# Patient Record
Sex: Male | Born: 1960 | Race: White | Hispanic: No | Marital: Single | Smoking: Former smoker
Health system: Southern US, Community
[De-identification: ages and names within clinical notes are randomized; demographics above are authoritative.]

## PROBLEM LIST (undated history)

## (undated) DIAGNOSIS — E785 Hyperlipidemia, unspecified: Secondary | ICD-10-CM

## (undated) DIAGNOSIS — M1990 Unspecified osteoarthritis, unspecified site: Secondary | ICD-10-CM

## (undated) HISTORY — PX: NO PAST SURGERIES: SHX2092

---

## 2000-12-25 ENCOUNTER — Inpatient Hospital Stay (HOSPITAL_COMMUNITY): Admission: AD | Admit: 2000-12-25 | Discharge: 2000-12-30 | Payer: Self-pay | Admitting: Specialist

## 2004-04-11 ENCOUNTER — Emergency Department (HOSPITAL_COMMUNITY): Admission: EM | Admit: 2004-04-11 | Discharge: 2004-04-11 | Payer: Self-pay | Admitting: Emergency Medicine

## 2004-10-28 ENCOUNTER — Ambulatory Visit: Payer: Self-pay | Admitting: Internal Medicine

## 2004-11-04 ENCOUNTER — Ambulatory Visit: Payer: Self-pay | Admitting: Internal Medicine

## 2010-02-25 ENCOUNTER — Emergency Department (HOSPITAL_COMMUNITY): Admission: EM | Admit: 2010-02-25 | Discharge: 2009-04-15 | Payer: Self-pay | Admitting: Emergency Medicine

## 2010-05-05 ENCOUNTER — Encounter (INDEPENDENT_AMBULATORY_CARE_PROVIDER_SITE_OTHER): Payer: Self-pay | Admitting: *Deleted

## 2010-05-12 NOTE — Letter (Signed)
Summary: Pre Visit Letter Revised  [facility]  [address] [place], [place] [address]   Phone: [phone]  Fax: [phone]        05/05/2010 MRN: [id number] [facility] [address] [place], [place]  [address]             Procedure Date:  06/16/2010 @ 10:00   Direct colon-Dr. Jarold Motto   Welcome to the Gastroenterology Division at [facility].    You are scheduled to see a nurse for your pre-procedure visit on 06/02/2010 at 10:30 on the 3rd floor at [facility], [address]  We ask that you try to arrive at our office 15 minutes prior to your appointment time to allow for check-in.  Please take a minute to review the attached form.  If you answer "Yes" to one or more of the questions on the first page, we ask that you call the person listed at your earliest opportunity.  If you answer "No" to all of the questions, please complete the rest of the form and bring it to your appointment.    Your nurse visit will consist of discussing your medical and surgical history, your immediate family medical history, and your medications.   If you are unable to list all of your medications on the form, please bring the medication bottles to your appointment and we will list them.  We will need to be aware of both prescribed and over the counter drugs.  We will need to know exact dosage information as well.    Please be prepared to read and sign documents such as consent forms, a financial agreement, and acknowledgement forms.  If necessary, and with your consent, a friend or relative is welcome to sit-in on the nurse visit with you.  Please bring your insurance card so that we may make a copy of it.  If your insurance requires a referral to see a specialist, please bring your referral form from your primary care physician.  No co-pay is required for this nurse visit.     If you cannot keep your appointment, please call [phone] to cancel or reschedule  prior to your appointment date.  This allows [place] the opportunity to schedule an appointment for another patient in need of care.    Thank you for choosing  Gastroenterology for your medical needs.  We appreciate the opportunity to care for you.  Please visit [place] at our website  to learn more about our practice.  Sincerely, The Gastroenterology Division

## 2010-06-06 LAB — POCT CARDIAC MARKERS
CKMB, poc: <1 ng/mL — ABNORMAL LOW (ref 1.0–8.0)
Troponin i, poc: <0.05 ng/mL (ref 0.00–0.09)

## 2010-06-06 LAB — BASIC METABOLIC PANEL
CO2: 27 mEq/L (ref 19–32)
GFR calc non Af Amer: >60 mL/min (ref >60)
Glucose, Bld: 113 mg/dL — ABNORMAL HIGH (ref 70–99)
Potassium: 5 mEq/L (ref 3.5–5.1)
Sodium: 134 mEq/L — ABNORMAL LOW (ref 135–145)

## 2010-06-06 LAB — DIFFERENTIAL
Basophils Absolute: 0 10*3/uL (ref 0.0–0.1)
Eosinophils Relative: 2 % (ref 0–5)
Lymphocytes Relative: 19 % (ref 12–46)
Monocytes Absolute: 1 10*3/uL (ref 0.1–1.0)
Monocytes Relative: 13 % — ABNORMAL HIGH (ref 3–12)

## 2010-06-06 LAB — CBC
HCT: 44.1 % (ref 39.0–52.0)
Hemoglobin: 14.2 g/dL (ref 13.0–17.0)
RBC: 5.9 MIL/uL — ABNORMAL HIGH (ref 4.22–5.81)
RDW: 15.9 % — ABNORMAL HIGH (ref 11.5–15.5)

## 2010-06-08 NOTE — Letter (Signed)
Summary: [facility] No Show Letter  [facility]  [address] [place], [place] [address]   Phone: [phone]  Fax: [phone]      June 02, 2010 MRN: [id number]   [facility] [address] [place], [place]  [address]      You were scheduled for a previsit for an endoscopic procedure with Dr. Jarold Motto on Wednesday 06/02/10 at the [facility] but you did not keep the appointment.    Your provider recommended this procedure for the benefit of your health.  It is very important that you reschedule it.  Failure to do so may be to the detriment of your health.  Please call us at [phone] and we will be happy to assist you with rescheduling.    If you were referred for this procedure by another physician/provider, we will notify him/her that you did not keep your appointment.   Sincerely, Doristine Church, RN  [facility]

## 2010-06-16 ENCOUNTER — Other Ambulatory Visit: Payer: Self-pay | Admitting: Gastroenterology

## 2010-08-06 NOTE — Discharge Summary (Signed)
[place]  Patient:    TALLIS, SOLEDAD Visit Number: [id number] MRN: [id number]          Service Type: MED Location: [id number] Attending Physician:  Erasmo Leventhal Dictated by:   Alexzandrew L. Perkins, P.A.-C. Admit Date:  12/25/2000 Discharge Date: 12/30/2000                             Discharge Summary  ADMISSION DIAGNOSES: 1. Cellulitis right lower extremity. 2. Recent contusion right leg.  DISCHARGE DIAGNOSES: 1. Cellulitis right lower extremity. 2. Recent contusion right leg. 3. Status post local incision and drainage in the office.  PROCEDURES:  There were no procedures in the hospital.  CONSULTS:  Infectious disease, Dewayne Shorter, M.D.  BRIEF HISTORY:  The patient is a 50 year old male who sustained a fall off a ladder and contused his right lower extremity approximately one and a half weeks prior to admission to the hospital.  He developed pain and swelling and therefore went to [facility].  He was seen initially and evaluated, started on anti-inflammatories and pain medications.  No fractures were noted. He did have some superficial abrasions to the shin.  He was started on antibiotics due to his swelling and erythema that occurred.  He was initially given a shot of Rocephin and placed on Keflex.  He was seen back in follow-up where a local I&D was performed on the right leg.  Due to the continued pain and swelling, he was referred over to [facility]. The patient was seen and evaluated by R. Valma Cava, M.D., and admitted for IV antibiotics.  LABORATORY DATA:  CBC on admission showed hemoglobin of 12.9, hematocrit 38.8, white cell count 9.3, red cell count 5.40; differential within normal limits. Chemistry panel on admission showed slightly elevated ALT of 59, remaining chemistry panel all within normal limits.  HOSPITAL COURSE:  The patient was admitted to [facility]. [facility], placed on bedrest, started on IV antibiotics.  He was placed on vancomycin. Infectious disease consult was called in. The patient was seen in consultation by Dr. Cliffton Asters of infectious disease.  Culture was taken in the office prior to admission. This was sent out to [facility].  The patient was placed on wound care and remained at bedrest with bathroom privileges.  X-rays of the leg were done prior to admission.  They did not reveal any foreign bodies in the area.  The leg was followed very closely from day to day. The swelling and erythema did improve and respond to the IV antibiotics.  It was felt as long as the patient continued to improve, that he may be able to be switched over to p.o. antibiotics.  He continued to receive IV vancomycin for several days and wet-to-dry dressing changes to the open wound.  It was felt after several days of IV antibiotics that the patient could be safely converted over to Levaquin 500 q.d. for approximately 10 to 14 more days.  He continued to improve and by December 30, 2000, the patient was doing quite well, much less induration and erythema.  It was decided the patient would be switched over to Levaquin and was discharged home on December 30, 2000.  DISCHARGE PLAN: 1. The patient discharged to home on December 30, 2000. 2. Discharge diagnoses:  Please see above. 3. Discharge medications:    a. Levaquin 500 mg one p.o. q.d. for two  weeks.    b. Enteric coated aspirin by mouth daily.    c. Senokot 30 b.i.d.    d. Percocet 40 p.r.n. pain. 4. Diet as tolerated. 5. Activity:  Continue with wound care.  Elevation for edema. 6. Follow-up:  He will follow up in the office on Monday following discharge.  DISPOSITION:  Home.  CONDITION ON DISCHARGE:  Improving.  Dictated by:   Alexzandrew L. Perkins, P.A.-C. Attending Physician:  Erasmo Leventhal DD:  01/25/01 TD:  01/26/01 Job: [id number]

## 2010-08-06 NOTE — H&P (Signed)
[place]  Patient:    Russell Brennan, Russell Brennan Visit Number: [id number] MRN: [id number]          Service Type: Attending:  R. Valma Cava, M.D. Dictated by:   Ottie Glazier. Wynona Neat, P.A.-C. Adm. Date:  12/25/00                           History and Physical  DATE OF BIRTH: 1961-02-11  CHIEF COMPLAINT: Right leg pain.  HISTORY OF PRESENT ILLNESS: This is a 50 year old white male, who approximately 1-1/2 weeks ago fell off a ladder and landed on his shins.  The patient developed pain and swelling and therefore he went to [facility].  The patient was initially seen and evaluated and started on anti-inflammatories and pain medicines.  There was no fracture noted.  He did have some abrasions to the bilateral shins, for which he was subsequently started on antibiotics at [facility] as well.  Swelling and erythema continued over his bilateral lower extremities.  Therefore, he was given a shot of Rocephin and continued on Keflex, and subsequently he was seen and evaluated by Dr. Andi Devon at [facility], where an I&D was performed, as well as he was given a shot of Rocephin.  However, due to these treatments the patient states that the leg has persistently continued to give him problems; i.e., pain, swelling, and erythema.  Therefore, he was referred to be evaluated at the acute care clinic here at [facility].  He is currently taking Percocet 7.5 mg one to two p.o. q.4h to q.6h p.r.n. pain as well as Levaquin one q.d. starting this Friday.  ALLERGIES: No known drug allergies.  MEDICATIONS: See HPI.  PAST MEDICAL HISTORY: Negative.  PAST SURGICAL HISTORY: I&D of right leg, as mentioned in HPI.  SOCIAL HISTORY: He is single.  He does not smoke.  Denies any alcohol use.  FAMILY HISTORY: States his father has a "heart condition."  REVIEW OF SYSTEMS: GENERAL: The patient states he has occasional night sweats but this is secondary to his  not having an air conditioner.  No loss of appetite, loss in weight, fever or chills.  HEENT: No history of chronic headache, sore throat, runny nose.  CHEST: No history of chronic cough, persistent cough, hemoptysis, or productive cough.  CARDIOVASCULAR: He denies any shortness of breath or syncopal episodes, chest pain, or irregular heart beat.  GI/GU: Denies any history of chronic diarrhea, constipation, melena, bright red stools per rectum, or dysuria.  EXTREMITIES: Please see HPI. NEUROLOGIC: Denies any history of seizure or strokes, numbness, tingling, paresthesias, or paralysis.  PHYSICAL EXAMINATION:  VITAL SIGNS: Blood pressure 128/80, pulse 70, respirations 16.  GENERAL: This is a pleasant 50 year old white male, in no acute distress.  HEENT: Head normocephalic, atraumatic.  NECK: Supple without masses.  CHEST: Clear to auscultation bilaterally.  HEART: S1 and S2, regular rate and rhythm.  ABDOMEN: Soft, flat, nontender.  Bowel sounds positive.  EXTREMITIES:  The right lower extremity is erythematous, hot, and swollen. There is incision from previous I&D.  There is evidence of old blistering noted.  He has some abrasions about the left lower extremity as well.  His right ankle is swollen with some ecchymosis located posteriorly to the medial malleolus.  LABORATORY DATA/X-RAYS: Laboratories of the right lower extremity are unremarkable.  IMPRESSION: Cellulitis, right lower extremity.  PLAN: The patient will be admitted to [facility]. [facility] per Dr. Thomasena Edis, where he  will undergo IV antibiotics and will continue to monitor. Prior to admission he underwent I&D as well as insertion of a Penrose drain in the office. Dictated by:   Ottie Glazier. Wynona Neat, P.A.-C. Attending:  R. Valma Cava, M.D. DD:  12/25/00 TD:  12/26/00 Job: [id number]

## 2012-05-06 ENCOUNTER — Emergency Department (HOSPITAL_COMMUNITY)
Admission: EM | Admit: 2012-05-06 | Discharge: 2012-05-06 | Disposition: A | Discharge status: Home / Self Care | Payer: Self-pay | Attending: Emergency Medicine | Admitting: Emergency Medicine

## 2012-05-06 ENCOUNTER — Encounter (HOSPITAL_COMMUNITY): Payer: Self-pay | Admitting: Emergency Medicine

## 2012-05-06 DIAGNOSIS — M7989 Other specified soft tissue disorders: Secondary | ICD-10-CM | POA: Insufficient documentation

## 2012-05-06 DIAGNOSIS — Z8639 Personal history of other endocrine, nutritional and metabolic disease: Secondary | ICD-10-CM | POA: Insufficient documentation

## 2012-05-06 DIAGNOSIS — J029 Acute pharyngitis, unspecified: Secondary | ICD-10-CM | POA: Insufficient documentation

## 2012-05-06 DIAGNOSIS — Z862 Personal history of diseases of the blood and blood-forming organs and certain disorders involving the immune mechanism: Secondary | ICD-10-CM | POA: Insufficient documentation

## 2012-05-06 DIAGNOSIS — K122 Cellulitis and abscess of mouth: Secondary | ICD-10-CM

## 2012-05-06 DIAGNOSIS — L409 Psoriasis, unspecified: Secondary | ICD-10-CM

## 2012-05-06 DIAGNOSIS — IMO0002 Reserved for concepts with insufficient information to code with codable children: Secondary | ICD-10-CM | POA: Insufficient documentation

## 2012-05-06 DIAGNOSIS — R197 Diarrhea, unspecified: Secondary | ICD-10-CM | POA: Insufficient documentation

## 2012-05-06 DIAGNOSIS — R110 Nausea: Secondary | ICD-10-CM | POA: Insufficient documentation

## 2012-05-06 DIAGNOSIS — Z87828 Personal history of other (healed) physical injury and trauma: Secondary | ICD-10-CM | POA: Insufficient documentation

## 2012-05-06 DIAGNOSIS — Z87891 Personal history of nicotine dependence: Secondary | ICD-10-CM | POA: Insufficient documentation

## 2012-05-06 DIAGNOSIS — R350 Frequency of micturition: Secondary | ICD-10-CM | POA: Insufficient documentation

## 2012-05-06 DIAGNOSIS — B349 Viral infection, unspecified: Secondary | ICD-10-CM

## 2012-05-06 DIAGNOSIS — B9789 Other viral agents as the cause of diseases classified elsewhere: Secondary | ICD-10-CM | POA: Insufficient documentation

## 2012-05-06 DIAGNOSIS — L299 Pruritus, unspecified: Secondary | ICD-10-CM | POA: Insufficient documentation

## 2012-05-06 DIAGNOSIS — R591 Generalized enlarged lymph nodes: Secondary | ICD-10-CM

## 2012-05-06 DIAGNOSIS — R599 Enlarged lymph nodes, unspecified: Secondary | ICD-10-CM | POA: Insufficient documentation

## 2012-05-06 DIAGNOSIS — L408 Other psoriasis: Secondary | ICD-10-CM | POA: Insufficient documentation

## 2012-05-06 HISTORY — DX: Hyperlipidemia, unspecified: E78.5

## 2012-05-06 LAB — GLUCOSE, CAPILLARY: Glucose-Capillary: 103 mg/dL — ABNORMAL HIGH (ref 70–99)

## 2012-05-06 MED ORDER — TRIAMCINOLONE ACETONIDE 0.1 % EX CREA: TOPICAL_CREAM | Freq: Three times a day (TID) | CUTANEOUS | Status: DC

## 2012-05-06 MED ORDER — AZITHROMYCIN 250 MG PO TABS
500.0000 mg | ORAL_TABLET | Freq: Once | ORAL | Status: AC
  Administered 2012-05-06: 500 mg via ORAL
  Filled 2012-05-06: qty 2

## 2012-05-06 MED ORDER — DEXAMETHASONE 10 MG/ML FOR PEDIATRIC ORAL USE
10.0000 mg | Freq: Once | INTRAMUSCULAR | Status: AC
  Administered 2012-05-06: 10 mg via ORAL
  Filled 2012-05-06: qty 1

## 2012-05-06 MED ORDER — PREDNISONE 20 MG PO TABS: 40.0000 mg | ORAL_TABLET | Freq: Every day | ORAL | Status: DC

## 2012-05-06 MED ORDER — AZITHROMYCIN 250 MG PO TABS: 250.0000 mg | ORAL_TABLET | Freq: Every day | ORAL | Status: DC

## 2012-05-06 MED ORDER — AMOXICILLIN 500 MG PO CAPS
500.0000 mg | ORAL_CAPSULE | Freq: Once | ORAL | Status: DC
  Filled 2012-05-06: qty 1

## 2012-05-06 NOTE — ED Notes (Addendum)
Pt states that he began having bumps/on his left arm 3 weeks ago. More bumps have come and gone since then. Pt states he was bit by a small black round spider a month ago. Pt believes it could also possibly be scabies but he has no knowledge of any exposure. Pt has a newer bump on his left neck along with some minor swelling. Pt also stating that his head is feeling "funny."

## 2012-05-06 NOTE — ED Provider Notes (Signed)
History    This chart was scribed for non-physician practitioner working with Russell Russell B. Bernette Mayers, MD by Donne Anon, ED Scribe. This patient was seen in room WTR5/WTR5 and the patient's care was started at 2227.   CSN: [id number]  Arrival date & time 05/06/12  1851   First MD Initiated Contact with Patient 05/06/12 2227      Chief Complaint  Patient presents with  . Rash    The history is provided by the patient and medical records. No language interpreter was used.   LATRAVIOUS Brennan is a 52 y.o. male who presents to the Emergency Department complaining of gradual onset, constant, non changing red bumps on his left arm and both ankles described as itching and burning which began 3 weeks ago. He reports swollen lymph nodes on his neck that began today associated with sore throat which also began today. He reports increased uirinary frequency, diarrhea, and nausea. He denies any other pain. He has tried clove oil on the rash with moderate relief. He has a Hx of hyperlipidemia.  Past Medical History  Diagnosis Date  . Hyperlipemia     History reviewed. No pertinent past surgical history.  Family History  Problem Relation Age of Onset  . Cancer Mother   . Heart failure Father     History  Substance Use Topics  . Smoking status: Former Smoker    Quit date: 04/06/2007  . Smokeless tobacco: Never Used  . Alcohol Use: Yes     Comment: occasionally      Review of Systems  Constitutional: Negative for fever, diaphoresis, appetite change, fatigue and unexpected weight change.  HENT: Positive for sore throat. Negative for mouth sores and neck stiffness.   Eyes: Negative for visual disturbance.  Respiratory: Negative for cough, chest tightness, shortness of breath, wheezing and stridor.   Cardiovascular: Negative for chest pain.  Gastrointestinal: Positive for nausea and diarrhea. Negative for vomiting, abdominal pain and constipation.  Endocrine: Negative for polydipsia,  polyphagia and polyuria.  Genitourinary: Positive for frequency. Negative for dysuria, urgency and hematuria.  Musculoskeletal: Negative for back pain.  Skin: Positive for rash.  Allergic/Immunologic: Negative for immunocompromised state.  Neurological: Negative for syncope, light-headedness and headaches.  Hematological: Positive for adenopathy. Does not bruise/bleed easily.  Psychiatric/Behavioral: Negative for sleep disturbance. The patient is not nervous/anxious.   All other systems reviewed and are negative.    Allergies  Amoxicillin  Home Medications   Current Outpatient Rx  Name  Route  Sig  Dispense  Refill  . omega-3 acid ethyl esters (LOVAZA) 1 G capsule   Oral   Take 1 g by mouth 2 (two) times daily.         Marland Kitchen azithromycin (ZITHROMAX) 250 MG tablet   Oral   Take 1 tablet (250 mg total) by mouth daily. Take first 2 tablets together, then 1 every day until finished.   6 tablet   0   . predniSONE (DELTASONE) 20 MG tablet   Oral   Take 2 tablets (40 mg total) by mouth daily.   10 tablet   0   . triamcinolone cream (KENALOG) 0.1 %   Topical   Apply topically 3 (three) times daily.   30 g   0     BP 160/106  Pulse 87  Temp(Src) 98.5 F (36.9 C) (Oral)  Resp 18  Ht 6\' 1"  (1.854 m)  Wt 260 lb (117.935 kg)  BMI 34.31 kg/m2  SpO2 95%  Physical Exam  Nursing note and vitals reviewed. Constitutional: He is oriented to person, place, and time. He appears well-developed and well-nourished. No distress.  HENT:  Head: Normocephalic and atraumatic.  Right Ear: Tympanic membrane, external ear and ear canal normal. Tympanic membrane is not erythematous and not bulging.  Left Ear: Tympanic membrane, external ear and ear canal normal. Tympanic membrane is not erythematous and not bulging.  Nose: No mucosal edema or rhinorrhea. No epistaxis. Right sinus exhibits no maxillary sinus tenderness and no frontal sinus tenderness. Left sinus exhibits no maxillary sinus  tenderness and no frontal sinus tenderness.  Mouth/Throat: Uvula is midline and mucous membranes are normal. Mucous membranes are not pale and not cyanotic. Edematous present. Posterior oropharyngeal edema (mild) and posterior oropharyngeal erythema (mild) present. No oropharyngeal exudate or tonsillar abscesses.  Uvula swelling and mild erythema.  Eyes: Conjunctivae and EOM are normal. Pupils are equal, round, and reactive to light. No scleral icterus.  Neck: Normal range of motion and full passive range of motion without pain. Neck supple. No tracheal tenderness, no spinous process tenderness and no muscular tenderness present. No rigidity. No tracheal deviation and normal range of motion present. No Brudzinski's sign and no Kernig's sign noted.  Cardiovascular: Normal rate, regular rhythm, S1 normal, S2 normal, normal heart sounds and intact distal pulses.  Exam reveals no gallop and no friction rub.   No murmur heard. Pulses:      Radial pulses are 2+ on the right side, and 2+ on the left side.       Dorsalis pedis pulses are 2+ on the right side, and 2+ on the left side.       Posterior tibial pulses are 2+ on the right side, and 2+ on the left side.  Pulmonary/Chest: Effort normal and breath sounds normal. No stridor. No respiratory distress. He has no decreased breath sounds. He has no wheezes. He has no rhonchi. He has no rales. He exhibits no tenderness.  Abdominal: Soft. Normal appearance and bowel sounds are normal. He exhibits no mass. There is no tenderness. There is no rigidity, no rebound, no guarding, no CVA tenderness, no tenderness at McBurney's point and negative Murphy's sign.  Musculoskeletal: Normal range of motion. He exhibits no edema.  Lymphadenopathy:       Head (right side): Tonsillar adenopathy present. No submental, no submandibular, no preauricular, no posterior auricular and no occipital adenopathy present.       Head (left side): Tonsillar adenopathy present. No  submental, no submandibular, no preauricular, no posterior auricular and no occipital adenopathy present.    He has cervical adenopathy.       Right cervical: Superficial cervical adenopathy present. No deep cervical and no posterior cervical adenopathy present.      Left cervical: Superficial cervical adenopathy present. No deep cervical and no posterior cervical adenopathy present.    He has no axillary adenopathy.  Neurological: He is alert and oriented to person, place, and time.  Speech is clear and goal oriented Moves extremities without ataxia  Skin: Skin is warm and dry. Rash noted. He is not diaphoretic. There is erythema.  Left elbow extensor surface has 4 small, erythematous, plaque like silver scaling lesions with well defined margins without evidence of cellulitis. The area is excoriated. Bilateral lower leg has venous statis changes, as well  As erythematous, plaque like silver scaling lesions with well defined margins without evidence of cellulitis and significant excoriations.  Psychiatric: He has a normal mood and affect.    ED  Course  Procedures (including critical care time) DIAGNOSTIC STUDIES: Oxygen Saturation is 95% on room air, adequate by my interpretation.    COORDINATION OF CARE: 10:42 PM Discussed treatment plan which includes a blood sugar check, steroid, antibiotic and medication for cyrtosis with pt at bedside and pt agreed to plan.     Labs Reviewed  GLUCOSE, CAPILLARY - Abnormal; Notable for the following:    Glucose-Capillary 103 (*)    All other components within normal limits   No results found.   1. Viral syndrome   2. Lymphadenopathy   3. Uvulitis   4. Psoriasis       MDM  Elita Boone with symptoms consistent with a viral syndrome symptoms. Patient with mild tubulitis. He is afebrile, non-tachycardic, NAD, nontoxic appearing. He is handling his secretions without difficulty, he has no stridor. He has mild lymphadenopathy.  We'll give  antibiotics and prednisone for uvulitis.  Patient also with rash rarely the lower legs including the extensor surfaces consistent with psoriasis.  History of minor skin trauma to this area.  Will treat with triamcinolone and I recommended followup with dermatology.  I have also discussed reasons to return immediately to the ER.  Patient expresses understanding and agrees with plan.  Patient blood sugar 103 and no concern for diabetes.   1. Medications: Azithromycin, prednisone, triamcinolone, usual home medications 2. Treatment: rest, drink plenty of fluids, take medications as prescribed, do not scratch 3. Follow Up: Please followup with your primary doctor for discussion of your diagnoses and further evaluation after today's visit; if you do not have a primary care doctor use the resource guide provided to find one; followup with dermatology as needed   I personally performed the services described in this documentation, which was scribed in my presence. The recorded information has been reviewed and is accurate.        Dahlia Client Celisse Ciulla, PA-C 05/06/12 2342  Dahlia Client Aundreya Souffrant, PA-C 05/06/12 1610

## 2012-05-06 NOTE — ED Provider Notes (Signed)
Medical screening examination/treatment/procedure(s) were performed by non-physician practitioner and as supervising physician I was immediately available for consultation/collaboration.   Charles B. Bernette Mayers, MD 05/06/12 8469

## 2013-09-20 ENCOUNTER — Observation Stay (HOSPITAL_COMMUNITY)
Admission: EM | Admit: 2013-09-20 | Discharge: 2013-09-21 | Disposition: A | Discharge status: Home / Self Care | Payer: Self-pay | Attending: Internal Medicine | Admitting: Internal Medicine

## 2013-09-20 ENCOUNTER — Encounter (HOSPITAL_COMMUNITY): Payer: Self-pay | Admitting: Emergency Medicine

## 2013-09-20 ENCOUNTER — Observation Stay (HOSPITAL_COMMUNITY): Payer: Self-pay

## 2013-09-20 ENCOUNTER — Emergency Department (HOSPITAL_COMMUNITY): Payer: Self-pay

## 2013-09-20 DIAGNOSIS — R718 Other abnormality of red blood cells: Secondary | ICD-10-CM

## 2013-09-20 DIAGNOSIS — Z87891 Personal history of nicotine dependence: Secondary | ICD-10-CM | POA: Insufficient documentation

## 2013-09-20 DIAGNOSIS — Z880 Allergy status to penicillin: Secondary | ICD-10-CM | POA: Insufficient documentation

## 2013-09-20 DIAGNOSIS — M79609 Pain in unspecified limb: Secondary | ICD-10-CM | POA: Insufficient documentation

## 2013-09-20 DIAGNOSIS — Z862 Personal history of diseases of the blood and blood-forming organs and certain disorders involving the immune mechanism: Secondary | ICD-10-CM | POA: Insufficient documentation

## 2013-09-20 DIAGNOSIS — Z8639 Personal history of other endocrine, nutritional and metabolic disease: Secondary | ICD-10-CM | POA: Insufficient documentation

## 2013-09-20 DIAGNOSIS — R079 Chest pain, unspecified: Principal | ICD-10-CM | POA: Insufficient documentation

## 2013-09-20 DIAGNOSIS — L409 Psoriasis, unspecified: Secondary | ICD-10-CM | POA: Diagnosis present

## 2013-09-20 DIAGNOSIS — R0602 Shortness of breath: Secondary | ICD-10-CM | POA: Insufficient documentation

## 2013-09-20 DIAGNOSIS — L408 Other psoriasis: Secondary | ICD-10-CM

## 2013-09-20 DIAGNOSIS — E785 Hyperlipidemia, unspecified: Secondary | ICD-10-CM | POA: Diagnosis present

## 2013-09-20 HISTORY — DX: Unspecified osteoarthritis, unspecified site: M19.90

## 2013-09-20 LAB — BASIC METABOLIC PANEL
Anion gap: 14 (ref 5–15)
BUN: 15 mg/dL (ref 6–23)
CHLORIDE: 98 meq/L (ref 96–112)
CO2: 25 meq/L (ref 19–32)
CREATININE: 0.98 mg/dL (ref 0.50–1.35)
Calcium: 9.4 mg/dL (ref 8.4–10.5)
GFR calc Af Amer: >90 mL/min (ref >90)
GFR calc non Af Amer: >90 mL/min (ref >90)
GLUCOSE: 97 mg/dL (ref 70–99)
Potassium: 4.1 mEq/L (ref 3.7–5.3)
Sodium: 137 mEq/L (ref 137–147)

## 2013-09-20 LAB — CBC
HEMATOCRIT: 41.8 % (ref 39.0–52.0)
Hemoglobin: 14 g/dL (ref 13.0–17.0)
MCH: 24.3 pg — AB (ref 26.0–34.0)
MCHC: 33.5 g/dL (ref 30.0–36.0)
MCV: 72.7 fL — AB (ref 78.0–100.0)
Platelets: 258 10*3/uL (ref 150–400)
RBC: 5.75 MIL/uL (ref 4.22–5.81)
RDW: 14.5 % (ref 11.5–15.5)
WBC: 7.3 10*3/uL (ref 4.0–10.5)

## 2013-09-20 LAB — I-STAT TROPONIN, ED: Troponin i, poc: 0 ng/mL (ref 0.00–0.08)

## 2013-09-20 LAB — D-DIMER, QUANTITATIVE (NOT AT ARMC): D DIMER QUANT: 0.32 ug{FEU}/mL (ref 0.00–0.48)

## 2013-09-20 LAB — TROPONIN I

## 2013-09-20 MED ORDER — NITROGLYCERIN 0.4 MG SL SUBL
0.4000 mg | SUBLINGUAL_TABLET | SUBLINGUAL | Status: DC | PRN
  Administered 2013-09-20: 0.4 mg via SUBLINGUAL
  Filled 2013-09-20: qty 1

## 2013-09-20 MED ORDER — MORPHINE SULFATE 2 MG/ML IJ SOLN: 2.0000 mg | INTRAMUSCULAR | Status: DC | PRN

## 2013-09-20 MED ORDER — ASPIRIN 81 MG PO CHEW
324.0000 mg | CHEWABLE_TABLET | Freq: Once | ORAL | Status: AC
  Administered 2013-09-20: 324 mg via ORAL
  Filled 2013-09-20: qty 4

## 2013-09-20 MED ORDER — ENOXAPARIN SODIUM 40 MG/0.4ML ~~LOC~~ SOLN
40.0000 mg | SUBCUTANEOUS | Status: DC
  Administered 2013-09-20: 40 mg via SUBCUTANEOUS
  Filled 2013-09-20 (×2): qty 0.4

## 2013-09-20 MED ORDER — ONDANSETRON HCL 4 MG/2ML IJ SOLN: 4.0000 mg | Freq: Four times a day (QID) | INTRAMUSCULAR | Status: DC | PRN

## 2013-09-20 MED ORDER — ACETAMINOPHEN 325 MG PO TABS: 650.0000 mg | ORAL_TABLET | ORAL | Status: DC | PRN

## 2013-09-20 MED ORDER — GI COCKTAIL ~~LOC~~: 30.0000 mL | Freq: Four times a day (QID) | ORAL | Status: DC | PRN

## 2013-09-20 NOTE — ED Notes (Signed)
Attempted report 

## 2013-09-20 NOTE — ED Notes (Signed)
EKG staff notified EKG not crossing over. sts unable to find EKG in system. Will re-capture.

## 2013-09-20 NOTE — ED Notes (Signed)
Patient transported to X-ray 

## 2013-09-20 NOTE — ED Notes (Signed)
Per pt sts chest discmfort longer than a month and woke up this am and worse. sts he took cayenne pepper this am . sts left arm numbness and lightheaded.

## 2013-09-20 NOTE — H&P (Signed)
[facility] History and Physical  Russell BooneDavid C Brennan MWN:[id number] DOB: 1961-02-10 DOA: 09/20/2013  Referring physician: Tiffany KocherScott Brennan, ER physician PCP: No PCP Per Patient   Chief Complaint: Chest pain  HPI: Russell Brennan is a 53 y.o. male  With past oral history of hyperlipidemia who presented to the emergency room with complaints of chest pain today, 09/20/13. In the per history, patient actually admits that he's had this pain going on for quite some time he is possible is longer than one year. Has gotten worse over the past month and today felt it was pretty bad. Chest pain is described as sort of an aching nagging catching-like feeling over the left side of his chest with radiation to his left axilla and down his left arm to about the elbow. He states it is worse when he tries to sit up rolling forward or turning certain direction. When asked him if it made him short of breath he said yes at times. He's had no dizziness from this. The chest pain does not radiate elsewhere. Today it was bad as an 8 or 9/10 she came into the emergency room. In the emergency room, EKG, lab work and chest x-ray were unremarkable. Troponins were normal. Patient was given nitroglycerin which seemed to greatly bring down his pain. Hospitals were called for further evaluation and admission.   Review of Systems:  Seen after arriving to floor. Doing okay. Denies any headaches, vision changes, dysphagia, palpitations, shortness of breath, wheeze, cough, abdominal pain, hematuria, dysuria, constipation, diarrhea, focal extremity numbness or weakness or pain other than described above. No nausea vomiting. His only positive review of systems as a mild nagging catching feeling in the left side of his chest radiating to his left axilla and down part of his left arm. His review systems otherwise negative.   Past Medical History  Diagnosis Date  . Hyperlipemia    History reviewed. No pertinent past surgical  history. Social History:  reports that he quit smoking about 6 years ago. He has never used smokeless tobacco. He reports that he drinks alcohol. He reports that he uses illicit drugs. patient lives at home alone. He is quite active requiring no assistance  Allergies  Allergen Reactions  . Amoxicillin Other (See Comments)    Unknown     Family History  Problem Relation Age of Onset  . Cancer Mother   . Heart failure Father    hyperlipidemia on both sides of the family  Prior to Admission medications   Not on File   Physical Exam: Filed Vitals:   09/20/13 1802  BP: 129/75  Pulse: 62  Temp: 98.1 F (36.7 C)  Resp: 18    BP 129/75  Pulse 62  Temp(Src) 98.1 F (36.7 C) (Oral)  Resp 18  SpO2 100%  General:  Appears calm and comfortable Eyes: Sclera nonicteric, extraocular movements are intact ENT: Normocephalic, atraumatic, mucous membranes are moist Neck: No JVD Cardiovascular: Regular rate and rhythm, S1-S2, soft 2/6 benign murmur Chest wall: Left side is mildly tender with palpation Respiratory: Clear to auscultation bilaterally Abdomen: Soft, nontender, nondistended, positive bowel sounds Skin: Mild dark plaques on bilateral anterior legs below the knees. Musculoskeletal: No clubbing or cyanosis or edema. Patient does have some pain in the left axilla left chest region with left shoulder extension and rotation Psychiatric: Patient is appropriate, no evidence of psychosis he Neurologic: No focal deficits           Labs on Admission:  Basic Metabolic  Panel:  Recent Labs Lab 09/20/13 1433  NA 137  K 4.1  CL 98  CO2 25  GLUCOSE 97  BUN 15  CREATININE 0.98  CALCIUM 9.4   Liver Function Tests: No results found for this basename: AST, ALT, ALKPHOS, BILITOT, PROT, ALBUMIN,  in the last 168 hours No results found for this basename: LIPASE, AMYLASE,  in the last 168 hours No results found for this basename: AMMONIA,  in the last 168 hours CBC:  Recent  Labs Lab 09/20/13 1433  WBC 7.3  HGB 14.0  HCT 41.8  MCV 72.7*  PLT 258   Cardiac Enzymes: No results found for this basename: CKTOTAL, CKMB, CKMBINDEX, TROPONINI,  in the last 168 hours  BNP (last 3 results) No results found for this basename: PROBNP,  in the last 8760 hours CBG: No results found for this basename: GLUCAP,  in the last 168 hours  Radiological Exams on Admission: Dg Chest 2 View  09/20/2013   CLINICAL DATA:  Left-sided chest for 2 weeks ; previous history of tobacco use  EXAM: CHEST  2 VIEW  COMPARISON:  PA and lateral chest of April 15, 2009  FINDINGS: The lungs are well-expanded and clear. The heart and mediastinal structures are normal. There is no pleural effusion. The bony thorax is unremarkable.  IMPRESSION: There is no acute cardiopulmonary abnormality.   Electronically Signed   By: Akiva  [place]   On: 09/20/2013 15:24    EKG: Independently reviewed. Normal sinus rhythm  Assessment/Plan Principal Problem:   Chest pain: Suspect this may be more musculoskeletal given the atypical nature, tenderness to chest wall and especially when moving the arm and shoulder. We'll check left shoulder x-ray. Could consider shoulder MRI Active Problems:   Microcytosis: Incidentally noted. MCV at 72, but with normal hemoglobin.   Psoriasis: Appears to be relatively quiet. Patient first plaques a year or 2 ago.    Hyperlipidemia: Patient states that he was previously diagnosed with this. Advised to go on medication, but he wanted to try diet only. Recheck labs in the morning. May need statin.   Code Status: Full code  Family Communication: No family  Disposition Plan: Possibly home tomorrow  Time spent: 25 minutes  Hollice EspyKRISHNAN,Shaunn Tackitt K [facility] Pager [phone]  **Disclaimer: This note may have been dictated with voice recognition software. Similar sounding words can inadvertently be transcribed and this note may contain transcription errors which may not have  been corrected upon publication of note.**

## 2013-09-20 NOTE — ED Provider Notes (Signed)
CSN: [id number]     Arrival date & time 09/20/13  1415 History   First MD Initiated Contact with Patient 09/20/13 1501     Chief Complaint  Patient presents with  . Chest Pain     (Consider location/radiation/quality/duration/timing/severity/associated sxs/prior Treatment) HPI 53 year old male presents with chest pain since last night. He states for the past month he's been having intermittent chest pain that has been much milder than this. He states last night started having more severe pain. Currently rates the pain as a 7/10. Describes as a dull aching pain on the left side of his chest and left arm. Feels somewhat short of breath. It is worse with exertion and movement. States it is somewhat worse with inspiration. Denies any cough or fever. Is unable to clarify how long it was typically lasting over the past month or how often he came on. Does not know what had previously brought it on. We went to work today he states he had to rest more often than normal because of the pain. Took some cayenne pepper today and states the pain did not worsen since then. He had pain when he went to bed last night and since waking up has had continuous pain as well. He has a history of hyperlipidemia, is not on any meds. Denies HTN or DM. Father died of heart attack at age 55, unsure of when his first onset of cardiac disease was.  Past Medical History  Diagnosis Date  . Hyperlipemia    History reviewed. No pertinent past surgical history. Family History  Problem Relation Age of Onset  . Cancer Mother   . Heart failure Father    History  Substance Use Topics  . Smoking status: Former Smoker    Quit date: 04/06/2007  . Smokeless tobacco: Never Used  . Alcohol Use: Yes     Comment: occasionally    Review of Systems  Constitutional: Negative for fever and diaphoresis.  Respiratory: Positive for shortness of breath.   Cardiovascular: Positive for chest pain. Negative for leg swelling.   Gastrointestinal: Negative for nausea, vomiting and abdominal pain.  Musculoskeletal: Negative for back pain.  All other systems reviewed and are negative.     Allergies  Amoxicillin  Home Medications   Prior to Admission medications   Not on File   BP 134/77  Pulse 74  Temp(Src) 97.8 F (36.6 C) (Oral)  Resp 15  SpO2 97% Physical Exam  Nursing note and vitals reviewed. Constitutional: He is oriented to person, place, and time. He appears well-developed and well-nourished. No distress.  HENT:  Head: Normocephalic and atraumatic.  Right Ear: External ear normal.  Left Ear: External ear normal.  Nose: Nose normal.  Eyes: Right eye exhibits no discharge. Left eye exhibits no discharge.  Neck: Neck supple.  Cardiovascular: Normal rate, regular rhythm, normal heart sounds and intact distal pulses.   Pulmonary/Chest: Effort normal and breath sounds normal. He exhibits no tenderness.  Abdominal: Soft. He exhibits no distension. There is no tenderness.  Musculoskeletal: He exhibits no edema.  Neurological: He is alert and oriented to person, place, and time.  Skin: Skin is warm and dry.    ED Course  Procedures (including critical care time) Labs Review Labs Reviewed  CBC - Abnormal; Notable for the following:    MCV 72.7 (*)    MCH 24.3 (*)    All other components within normal limits  BASIC METABOLIC PANEL  D-DIMER, QUANTITATIVE  I-STAT TROPOININ, ED  Imaging Review Dg Chest 2 View  09/20/2013   CLINICAL DATA:  Left-sided chest for 2 weeks ; previous history of tobacco use  EXAM: CHEST  2 VIEW  COMPARISON:  PA and lateral chest of April 15, 2009  FINDINGS: The lungs are well-expanded and clear. The heart and mediastinal structures are normal. There is no pleural effusion. The bony thorax is unremarkable.  IMPRESSION: There is no acute cardiopulmonary abnormality.   Electronically Signed   By: Ilan  [place]   On: 09/20/2013 15:24   EKG #1  Date: 09/20/2013   Rate: 82  Rhythm: normal sinus rhythm  QRS Axis: normal  Intervals: normal  ST/T Wave abnormalities: nonspecific T wave changes  Conduction Disutrbances:none  Narrative Interpretation: Nonspecific T waves  Old EKG Reviewed: none available   EKG #2  EKG Interpretation   Date/Time:  Friday September 20 2013 15:40:12 EDT Ventricular Rate:  65 PR Interval:  165 QRS Duration: 96 QT Interval:  398 QTC Calculation: 414 R Axis:   59 Text Interpretation:  Sinus rhythm Artifact within those limitations,  otherwise normal ECG Confirmed by Anicia Leuthold  MD, Hermine Feria ([id number]) on 09/20/2013  3:49:17 PM      MDM   Final diagnoses:  Chest pain, unspecified chest pain type    Patient's pain improved with aspirin and nitroglycerin. Patient is a poor historian, but his chest pain shortness of breath on exertion is concerning. As nonspecific T waves on initial EKG that did not go into MUSE, repeat EKG shows artifact but none of these changes. Due to this, combined with lack of outpatient care, I feel he should be observed in the hospital for ACS rule out. His low-risk for pulmonary embolism and has negative d-dimer, I feel this is ruled out.    Audree CamelScott T Leeta Grimme, MD 09/20/13 [phone]

## 2013-09-21 ENCOUNTER — Observation Stay (HOSPITAL_COMMUNITY): Payer: Self-pay

## 2013-09-21 DIAGNOSIS — I517 Cardiomegaly: Secondary | ICD-10-CM

## 2013-09-21 LAB — LIPID PANEL
Cholesterol: 294 mg/dL — ABNORMAL HIGH (ref 0–200)
HDL: 44 mg/dL (ref >39)
LDL Cholesterol: 176 mg/dL — ABNORMAL HIGH (ref 0–99)
Total CHOL/HDL Ratio: 6.7 RATIO
Triglycerides: 368 mg/dL — ABNORMAL HIGH (ref <150)
VLDL: 74 mg/dL — ABNORMAL HIGH (ref 0–40)

## 2013-09-21 LAB — CK: Total CK: 115 U/L (ref 7–232)

## 2013-09-21 LAB — TROPONIN I

## 2013-09-21 MED ORDER — TRAMADOL HCL 50 MG PO TABS: 50.0000 mg | ORAL_TABLET | Freq: Four times a day (QID) | ORAL | Status: DC | PRN

## 2013-09-21 MED ORDER — IOHEXOL 300 MG/ML  SOLN
100.0000 mL | Freq: Once | INTRAMUSCULAR | Status: AC | PRN
  Administered 2013-09-21: 100 mL via INTRAVENOUS

## 2013-09-21 MED ORDER — ATORVASTATIN CALCIUM 40 MG PO TABS: 40.0000 mg | ORAL_TABLET | Freq: Every day | ORAL | Status: DC

## 2013-09-21 MED ORDER — ATORVASTATIN CALCIUM 40 MG PO TABS
40.0000 mg | ORAL_TABLET | Freq: Every day | ORAL | Status: DC
  Filled 2013-09-21: qty 1

## 2013-09-21 NOTE — Progress Notes (Signed)
Patient ID: Russell BooneDavid C Hofstra  male  UJW:[id number]    DOB: 10-27-1960    DOA: 09/20/2013  PCP: No PCP Per Patient  Assessment/Plan: Principal Problem:   Chest pain- atypical likely musculoskeletal, tenderness to chest wall and especially when moving the arm and shoulder - Ruled out for acute ACS, EKG showed no acute changes suggestive of ischemia - Patient extremely concerned about the chest pain and also stating that his friend had similar symptoms, risks factor includes a hyperlipidemia and his father had CAD and died of heart attack at age of 55, hence ordered 2-D echocardiogram - D-dimer negative, CT chest negative for any mass or rib fracture, incidentally found to T12 remote fracture - Continue tramadol as needed for pain  Active Problems:   Microcytosis - Incidentally found normal hemoglobin     Hyperlipidemia - Lipid panel showed cholesterol of 294, triglycerides 368, LDL 176, placed on Lipitor -CK normal    DVT Prophylaxis:  Code Status:  Family Communication: discussed in detail with the patient   Disposition: await 2-D echo results, hopefully DC home today   Consultants:  None   Procedures:  CT chest    2-D echo  Antibiotics:  None     Subjective: Patient seen and examined,  complaining of musculoskeletal atypical chest pain under the left armpit   Objective: Weight change:  No intake or output data in the 24 hours ending 09/21/13 1129 Blood pressure 121/64, pulse 63, temperature 97.2 F (36.2 C), temperature source Oral, resp. rate 18, SpO2 94.00%.  Physical Exam: General: Alert and awake, oriented x3, not in any acute distress. CVS: S1-S2 clear, no murmur rubs or gallops Chest: clear to auscultation bilaterally, no wheezing, rales or rhonchi + chest wall tenderness  Abdomen: soft nontender, nondistended, normal bowel sounds  Extremities: no cyanosis, clubbing or edema noted bilaterally Neuro: Cranial nerves II-XII intact, no focal neurological  deficits  Lab Results: Basic Metabolic Panel:  Recent Labs Lab 09/20/13 1433  NA 137  K 4.1  CL 98  CO2 25  GLUCOSE 97  BUN 15  CREATININE 0.98  CALCIUM 9.4   Liver Function Tests: No results found for this basename: AST, ALT, ALKPHOS, BILITOT, PROT, ALBUMIN,  in the last 168 hours No results found for this basename: LIPASE, AMYLASE,  in the last 168 hours No results found for this basename: AMMONIA,  in the last 168 hours CBC:  Recent Labs Lab 09/20/13 1433  WBC 7.3  HGB 14.0  HCT 41.8  MCV 72.7*  PLT 258   Cardiac Enzymes:  Recent Labs Lab 09/20/13 2045 09/21/13 0245 09/21/13 0845  CKTOTAL  --   --  115  TROPONINI <0.30 <0.30  --    BNP: No components found with this basename: POCBNP,  CBG: No results found for this basename: GLUCAP,  in the last 168 hours   Micro Results: No results found for this or any previous visit (from the past 240 hour(s)).  Studies/Results: Dg Chest 2 View  09/20/2013   CLINICAL DATA:  Left-sided chest for 2 weeks ; previous history of tobacco use  EXAM: CHEST  2 VIEW  COMPARISON:  PA and lateral chest of April 15, 2009  FINDINGS: The lungs are well-expanded and clear. The heart and mediastinal structures are normal. There is no pleural effusion. The bony thorax is unremarkable.  IMPRESSION: There is no acute cardiopulmonary abnormality.   Electronically Signed   By: Jaidyn  [place]   On: 09/20/2013 15:24   Ct  Chest W Contrast  09/21/2013   CLINICAL DATA:  Left-sided chest pain. The pain is worse with movement and deep inspiration. The pain became acutely worse yesterday.  EXAM: CT CHEST WITH CONTRAST  TECHNIQUE: Multidetector CT imaging of the chest was performed during intravenous contrast administration.  CONTRAST:  100mL OMNIPAQUE IOHEXOL 300 MG/ML  SOLN  COMPARISON:  Two-view chest x-ray 09/20/2013  FINDINGS: The heart size is normal. No significant mediastinal or axillary adenopathy is present. The thoracic inlet is within normal  limits. Limited imaging of the abdomen is unremarkable.  The lung windows demonstrate mild dependent atelectasis. No focal nodule, mass, or airspace disease is present.  Bone windows demonstrate a remote superior endplate fracture at T12. No acute fractures are present. No focal lytic or blastic lesions are evident.  IMPRESSION: 1. No acute or focal lesion to explain the patient's left-sided chest pain. 2. Remote T12 superior endplate fracture.   Electronically Signed   By: Gennette Pachris  Mattern M.D.   On: 09/21/2013 09:42   Dg Shoulder Left  09/20/2013   CLINICAL DATA:  Atraumatic left shoulder pain for 1 day  EXAM: LEFT SHOULDER - 2+ VIEW  COMPARISON:  None.  FINDINGS: The bones are adequately mineralized. [facility] is intact. The glenohumeral joint is normal. There is no significant degenerative change. The overlying soft tissues are normal.  IMPRESSION: There is no acute bony abnormality of the left shoulder.   Electronically Signed   By: Fabiano  [place]   On: 09/20/2013 19:50    Medications: Scheduled Meds: . atorvastatin  40 mg Oral q1800  . enoxaparin (LOVENOX) injection  40 mg Subcutaneous Q24H      LOS: 1 day   Kristine Chahal M.D. [facility] 09/21/2013, 11:29 AM Pager: [phone]  If 7PM-7AM, please contact night-coverage [email or web address] Password [email or web address]  **Disclaimer: This note was dictated with voice recognition software. Similar sounding words can inadvertently be transcribed and this note may contain transcription errors which may not have been corrected upon publication of note.**

## 2013-09-21 NOTE — Discharge Summary (Signed)
Physician Discharge Summary  Patient ID: Russell BooneDavid C Keiffer MRN: [id number] DOB/AGE: 04-17-1960 53 y.o.  Admit date: 09/20/2013 Discharge date: 09/21/2013  Primary Care Physician:  No PCP Per Patient  Discharge Diagnoses:    . atypical Chest pain likely musculoskeletal  . Microcytosis . Psoriasis . Hyperlipidemia  Consults:  None   Recommendations for Outpatient Follow-up:  Patient may need MRI of his left shoulder to rule out any referred pain to the chest from rotator cuff injury if his symptoms are not improved with the tramadol  Allergies:   Allergies  Allergen Reactions  . Amoxicillin Other (See Comments)    Unknown      Discharge Medications:   Medication List         atorvastatin 40 MG tablet  Commonly known as:  LIPITOR  Take 1 tablet (40 mg total) by mouth at bedtime.     traMADol 50 MG tablet  Commonly known as:  ULTRAM  Take 1 tablet (50 mg total) by mouth every 6 (six) hours as needed for moderate pain or severe pain.         Brief H and P: For complete details please refer to admission H and P, but in briefDavid C Mellissa Brennan is a 53 y.o. male  With past oral history of hyperlipidemia who presented to the emergency room with complaints of chest pain today, 09/20/13. In the per history, patient actually admits that he's had this pain going on for quite some time he is possible is longer than one year. Has gotten worse over the past month and today felt it was pretty bad. Chest pain is described as sort of an aching nagging catching-like feeling over the left side of his chest with radiation to his left axilla and down his left arm to about the elbow. Patient reported that it is worse when he tries to sit up rolling forward or turning certain direction. When asked him if it made him short of breath he said yes at times. He's had no dizziness from this. The chest pain does not radiate elsewhere. On the day of admission, it was bad as an 8 or 9/10 she came into the  emergency room.  In the emergency room, EKG, lab work and chest x-ray were unremarkable. Troponins were normal. Patient was given nitroglycerin which seemed to greatly bring down his pain.   Hospital Course:  Chest pain- atypical likely musculoskeletal, tenderness to chest wall and especially when moving the arm and shoulder  Patient was ruled out for acute ACS, EKG showed no acute changes suggestive of ischemia  As he was extremely concerned about the chest pain and also stating that his friend had similar symptoms, risks factor includes a hyperlipidemia and his father had CAD and died of heart attack at age of 55, 2-D echo was ordered which showed EF of 50-55%, no regional wall motion abnormalities. D-dimer was negative, CT chest negative for any mass or rib fracture, incidentally found to T12 remote fracture  Recommended patient tramadol as needed for pain. 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 I recommended him to follow [facility] and further outpatient workup if needed.  Microcytosis  - Incidentally found normal hemoglobin   Hyperlipidemia  - Lipid panel showed cholesterol of 294, triglycerides 368, LDL  176, placed on Lipitor  -CK normal . Follow LFTs in 4 weeks   Day of Discharge BP 121/64  Pulse 63  Temp(Src) 97.2 F (36.2 C) (Oral)  Resp 18  SpO2 94%  Physical Exam: General: Alert and awake oriented x3 not in any acute distress. CVS: S1-S2 clear no murmur rubs or gallops Chest: clear to auscultation bilaterally, no wheezing rales or rhonchi Abdomen: soft nontender, nondistended, normal bowel sounds Extremities: no cyanosis, clubbing or edema noted bilaterally Neuro: Cranial nerves II-XII intact, no focal neurological deficits   The results of  significant diagnostics from this hospitalization (including imaging, microbiology, ancillary and laboratory) are listed below for reference.    LAB RESULTS: Basic Metabolic Panel:  Recent Labs Lab 09/20/13 1433  NA 137  K 4.1  CL 98  CO2 25  GLUCOSE 97  BUN 15  CREATININE 0.98  CALCIUM 9.4   Liver Function Tests: No results found for this basename: AST, ALT, ALKPHOS, BILITOT, PROT, ALBUMIN,  in the last 168 hours No results found for this basename: LIPASE, AMYLASE,  in the last 168 hours No results found for this basename: AMMONIA,  in the last 168 hours CBC:  Recent Labs Lab 09/20/13 1433  WBC 7.3  HGB 14.0  HCT 41.8  MCV 72.7*  PLT 258   Cardiac Enzymes:  Recent Labs Lab 09/20/13 2045 09/21/13 0245 09/21/13 0845  CKTOTAL  --   --  115  TROPONINI <0.30 <0.30  --    BNP: No components found with this basename: POCBNP,  CBG: No results found for this basename: GLUCAP,  in the last 168 hours  Significant Diagnostic Studies:  Dg Chest 2 View  09/20/2013   CLINICAL DATA:  Left-sided chest for 2 weeks ; previous history of tobacco use  EXAM: CHEST  2 VIEW  COMPARISON:  PA and lateral chest of April 15, 2009  FINDINGS: The lungs are well-expanded and clear. The heart and mediastinal structures are normal. There is no pleural effusion. The bony thorax is unremarkable.  IMPRESSION: There is no acute cardiopulmonary abnormality.   Electronically Signed   By: Briyan  [place]   On: 09/20/2013 15:24   Ct Chest W Contrast  09/21/2013   CLINICAL DATA:  Left-sided chest pain. The pain is worse with movement and deep inspiration. The pain became acutely worse yesterday.  EXAM: CT CHEST WITH CONTRAST  TECHNIQUE: Multidetector CT imaging of the chest was performed during intravenous contrast administration.  CONTRAST:  OMNIPAQUE IOHEXOL 300 MG/ML  SOLN  COMPARISON:  Two-view chest x-ray 09/20/2013  FINDINGS: The heart size is normal. No significant mediastinal or axillary  adenopathy is present. The thoracic inlet is within normal limits. Limited imaging of the abdomen is unremarkable.  The lung windows demonstrate mild dependent atelectasis. No focal nodule, mass, or airspace disease is present.  Bone windows demonstrate a remote superior endplate fracture at T12. No acute fractures are present. No focal lytic or blastic lesions are evident.  IMPRESSION: 1. No acute or focal lesion to explain the patient's left-sided chest pain. 2. Remote T12 superior endplate fracture.   Electronically Signed   By: Gennette Pac M.D.   On: 09/21/2013 09:42   Dg Shoulder Left  09/20/2013   CLINICAL DATA:  Atraumatic left shoulder pain for 1 day  EXAM: LEFT SHOULDER - 2+ VIEW  COMPARISON:  None.  FINDINGS: The bones are adequately mineralized. The [facility] joint is intact. The glenohumeral joint is normal. There is no significant degenerative change.  The overlying soft tissues are normal.  IMPRESSION: There is no acute bony abnormality of the left shoulder.   Electronically Signed   By: Deon  [place]   On: 09/20/2013 19:50    2D ECHO: Study Conclusions  - Left ventricle: The cavity size was normal. Wall thickness was increased in a pattern of mild LVH. Systolic function was normal. The estimated ejection fraction was in the range of 55% to 60%. Indeterminant diastolic function. Wall motion was normal; there were no regional wall motion abnormalities. - Aortic valve: Valve area (VTI): 3.9 cm^2. Valve area (Vmax): 3.6 cm^2. - Right ventricle: The cavity size was mildly dilated. - Technically adequate study.    Disposition and Follow-up:     Discharge Instructions   Diet - low sodium heart healthy    Complete by:  As directed      Increase activity slowly    Complete by:  As directed             DISPOSITION: Home  DIET: Heart healthy diet   DISCHARGE FOLLOW-UP Follow-up Information   Follow up with [facility]. Schedule an appointment as  soon as possible for a visit in 2 weeks. (for hospital follow-up. please call on Monday 7/6 to schedule the appt)    Contact information:   [address] [place] [id number] [phone]      Time spent on Discharge: 40 mins  Signed:   RAI,RIPUDEEP M.D. [facility] 09/21/2013, 1:42 PM Pager: [phone]   **Disclaimer: This note was dictated with voice recognition software. Similar sounding words can inadvertently be transcribed and this note may contain transcription errors which may not have been corrected upon publication of note.**

## 2013-09-21 NOTE — Progress Notes (Signed)
Echocardiogram 2D Echocardiogram has been performed.  Dorothey BasemanReel, Ellionna Buckbee M 09/21/2013, 11:12 AM

## 2013-09-24 ENCOUNTER — Encounter: Payer: Self-pay | Admitting: *Deleted

## 2013-09-24 NOTE — Progress Notes (Signed)
Patient presents as a walk in clinic who is a hospital follow up admitted to hospital 7/3-7/4. Patient was discharged on Lipitor and Tramadol which patient has not filled at this time. Patient states he does get light headed when he goes from the lying position to sitting position. Patient had a cardiac work up while in hospital which was negative but still has chest pain especially while lying down. EKG on 09/22/2013 was normal sinus with bradycardia. Patient scheduled an appointment for 09/25/2013 at 10:30 AM with Dr. Algis Greenhouse Wall (cardiologist). Patient made aware of his lipid panel results and informed he needs to start taking his Lipitor. Patient verbalized understanding. Patient is concerned that he may have some blockages that were missed in the hospital.

## 2013-09-25 ENCOUNTER — Ambulatory Visit: Payer: Self-pay | Attending: Cardiology | Admitting: Cardiology

## 2013-09-25 ENCOUNTER — Encounter: Payer: Self-pay | Admitting: Cardiology

## 2013-09-25 VITALS — BP 137/83 | HR 63 | Resp 20 | Ht 73.0 in | Wt 249.0 lb

## 2013-09-25 DIAGNOSIS — Z87891 Personal history of nicotine dependence: Secondary | ICD-10-CM | POA: Insufficient documentation

## 2013-09-25 DIAGNOSIS — M25519 Pain in unspecified shoulder: Secondary | ICD-10-CM | POA: Insufficient documentation

## 2013-09-25 DIAGNOSIS — R079 Chest pain, unspecified: Secondary | ICD-10-CM | POA: Insufficient documentation

## 2013-09-25 DIAGNOSIS — E785 Hyperlipidemia, unspecified: Secondary | ICD-10-CM | POA: Insufficient documentation

## 2013-09-25 DIAGNOSIS — E669 Obesity, unspecified: Secondary | ICD-10-CM

## 2013-09-25 NOTE — Progress Notes (Signed)
HPI Russell Brennan comes in today for close followup after being admitted to the emergency room for chest discomfort radiating into the left armpit and shoulder.  He has several cardiac risk factors including age, sex, and hyperlipidemia. He is also obese.  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 The discomfort in his shoulder became constant on the day of admission and lasted for hours. Is mostly in the left pectoral area but goes into the axilla and posterior shoulder.  He had a normal EKG. Cardiac markers were negative. Left shoulder x-ray showed no bony abnormality. Chest CT showed no pulmonary embolus and no cardiomegaly or and no mention of coronary calcification. Echocardiogram was normal. LV function showed an EF of 55-60%. There was a suggestion of mild LVH. Lipids were significantly elevated with an LDL of 176 and triglycerides of 368. HDL is 44.   He denies any exertional chest discomfort or dyspnea on exertion.  There is no premature history of coronary disease in his family, he quit smoking in 2009, and he is not diabetic.  Past Medical History  Diagnosis Date  . Hyperlipemia   . Arthritis     "all over lately" (09/20/2013)    Current Outpatient Prescriptions  Medication Sig Dispense Refill  . aspirin 81 MG tablet Take 81 mg by mouth as needed for pain.      . traMADol (ULTRAM) 50 MG tablet Take 1 tablet (50 mg total) by mouth every 6 (six) hours as needed for moderate pain or severe pain.  60 tablet  0   No current facility-administered medications for this visit.    Allergies  Allergen Reactions  . Amoxicillin Other (See Comments)    Unknown     Family History  Problem Relation Age of Onset  . Cancer Mother   . Heart failure Father     History   Social History  . Marital Status: Single    Spouse Name: N/A    Number of Children: N/A  . Years of Education: N/A   Occupational History  . Not on file.   Social History Main Topics  . Smoking status: Former Smoker -- 0.50  packs/day for 20 years    Types: Cigarettes    Quit date: 04/06/2007  . Smokeless tobacco: Never Used  . Alcohol Use: Yes     Comment: 09/20/2013 "drink a couple times/year"  . Drug Use: Yes     Comment: 09/20/2013 "used some drugs in the 1970's; occasionally in the 1980's; long time since I've used any"  . Sexual Activity: Not Currently   Other Topics Concern  . Not on file   Social History Narrative  . No narrative on file    ROS ALL NEGATIVE EXCEPT THOSE NOTED IN HPI  PE  General Appearance: well developed, well nourished in no acute distress, muscular, obese HEENT: symmetrical face, PERRLA, good dentition  Neck: no JVD, thyromegaly, or adenopathy, trachea midline Chest: symmetric without deformity Cardiac: PMI non-displaced, RRR, normal S1, S2, no gallop or murmur Lung: clear to ausculation and percussion Vascular: all pulses full without bruits  Abdominal: nondistended, nontender, good bowel sounds, no HSM, no bruits Extremities: no cyanosis, clubbing or edema, no sign of DVT, no varicosities , left shoulder and axilla exam and without any abnormality except for soreness over his upper left pectoral muscle Skin: normal color, no rashes Neuro: alert and oriented x 3, non-focal Pysch: normal affect  EKG Not repeated BMET    Component Value Date/Time   NA 137 09/20/2013 1433  K 4.1 09/20/2013 1433   CL 98 09/20/2013 1433   CO2 25 09/20/2013 1433   GLUCOSE 97 09/20/2013 1433   BUN 15 09/20/2013 1433   CREATININE 0.98 09/20/2013 1433   CALCIUM 9.4 09/20/2013 1433   GFRNONAA >90 09/20/2013 1433   GFRAA >90 09/20/2013 1433    Lipid Panel     Component Value Date/Time   CHOL 294* 09/21/2013 0230   TRIG 368* 09/21/2013 0230   HDL 44 09/21/2013 0230   CHOLHDL 6.7 09/21/2013 0230   VLDL 74* 09/21/2013 0230   LDLCALC 176* 09/21/2013 0230    CBC    Component Value Date/Time   WBC 7.3 09/20/2013 1433   RBC 5.75 09/20/2013 1433   HGB 14.0 09/20/2013 1433   HCT 41.8 09/20/2013 1433   PLT 258 09/20/2013  1433   MCV 72.7* 09/20/2013 1433   MCH 24.3* 09/20/2013 1433   MCHC 33.5 09/20/2013 1433   RDW 14.5 09/20/2013 1433   LYMPHSABS 1.4 04/15/2009 0239   MONOABS 1.0 04/15/2009 0239   EOSABS 0.1 04/15/2009 0239   BASOSABS 0.0 04/15/2009 0239

## 2013-09-25 NOTE — Progress Notes (Signed)
Patient recently seen in ED on 09/20/13 for chest pain. Troponins and EKG were normal. Patient indicates he has had chest pain for 3+ months. Today patient indicates he is having chest soreness at this time; he rates pain 4/10.  Soreness on left chest, left arm. Denies shortness of breath, headaches. Some lightheadness.

## 2013-09-25 NOTE — Patient Instructions (Signed)
Please stop taking Lipitor. Begin a fat controlled diet. Return for fasting lipids in 3 months. Return to see Dr. Daleen SquibbWall in 3 months.  Fat and Cholesterol Control Diet Fat and cholesterol levels in your blood and organs are influenced by your diet. High levels of fat and cholesterol may lead to diseases of the heart, small and large blood vessels, gallbladder, liver, and pancreas. CONTROLLING FAT AND CHOLESTEROL WITH DIET Although exercise and lifestyle factors are important, your diet is key. That is because certain foods are known to raise cholesterol and others to lower it. The goal is to balance foods for their effect on cholesterol and more importantly, to replace saturated and trans fat with other types of fat, such as monounsaturated fat, polyunsaturated fat, and omega-3 fatty acids. On average, a person should consume no more than 15 to 17 g of saturated fat daily. Saturated and trans fats are considered "bad" fats, and they will raise LDL cholesterol. Saturated fats are primarily found in animal products such as meats, butter, and cream. However, that does not mean you need to give up all your favorite foods. Today, there are good tasting, low-fat, low-cholesterol substitutes for most of the things you like to eat. Choose low-fat or nonfat alternatives. Choose round or loin cuts of red meat. These types of cuts are lowest in fat and cholesterol. Chicken (without the skin), fish, veal, and ground [place]key breast are great choices. Eliminate fatty meats, such as hot dogs and salami. Even shellfish have little or no saturated fat. Have a 3 oz (85 g) portion when you eat lean meat, poultry, or fish. Trans fats are also called "partially hydrogenated oils." They are oils that have been scientifically manipulated so that they are solid at room temperature resulting in a longer shelf life and improved taste and texture of foods in which they are added. Trans fats are found in stick margarine, some tub  margarines, cookies, crackers, and baked goods.  When baking and cooking, oils are a great substitute for butter. The monounsaturated oils are especially beneficial since it is believed they lower LDL and raise HDL. The oils you should avoid entirely are saturated tropical oils, such as coconut and palm.  Remember to eat a lot from food groups that are naturally free of saturated and trans fat, including fish, fruit, vegetables, beans, grains (barley, rice, couscous, bulgur wheat), and pasta (without cream sauces).  IDENTIFYING FOODS THAT LOWER FAT AND CHOLESTEROL  Soluble fiber may lower your cholesterol. This type of fiber is found in fruits such as apples, vegetables such as broccoli, potatoes, and carrots, legumes such as beans, peas, and lentils, and grains such as barley. Foods fortified with plant sterols (phytosterol) may also lower cholesterol. You should eat at least 2 g per day of these foods for a cholesterol lowering effect.  Read package labels to identify low-saturated fats, trans fat free, and low-fat foods at the supermarket. Select cheeses that have only 2 to 3 g saturated fat per ounce. Use a heart-healthy tub margarine that is free of trans fats or partially hydrogenated oil. When buying baked goods (cookies, crackers), avoid partially hydrogenated oils. Breads and muffins should be made from whole grains (whole-wheat or whole oat flour, instead of "flour" or "enriched flour"). Buy non-creamy canned soups with reduced salt and no added fats.  FOOD PREPARATION TECHNIQUES  Never deep-fry. If you must fry, either stir-fry, which uses very little fat, or use non-stick cooking sprays. When possible, broil, bake, or roast meats,  and steam vegetables. Instead of putting butter or margarine on vegetables, use lemon and herbs, applesauce, and cinnamon (for squash and sweet potatoes). Use nonfat yogurt, salsa, and low-fat dressings for salads.  LOW-SATURATED FAT / LOW-FAT FOOD SUBSTITUTES Meats /  Saturated Fat (g)  Avoid: Steak, marbled (3 oz/85 g) / 11 g  Choose: Steak, lean (3 oz/85 g) / 4 g  Avoid: Hamburger (3 oz/85 g) / 7 g  Choose: Hamburger, lean (3 oz/85 g) / 5 g  Avoid: Ham (3 oz/85 g) / 6 g  Choose: Ham, lean cut (3 oz/85 g) / 2.4 g  Avoid: Chicken, with skin, dark meat (3 oz/85 g) / 4 g  Choose: Chicken, skin removed, dark meat (3 oz/85 g) / 2 g  Avoid: Chicken, with skin, light meat (3 oz/85 g) / 2.5 g  Choose: Chicken, skin removed, light meat (3 oz/85 g) / 1 g Dairy / Saturated Fat (g)  Avoid: Whole milk (1 cup) / 5 g  Choose: Low-fat milk, 2% (1 cup) / 3 g  Choose: Low-fat milk, 1% (1 cup) / 1.5 g  Choose: Skim milk (1 cup) / 0.3 g  Avoid: Hard cheese (1 oz/28 g) / 6 g  Choose: Skim milk cheese (1 oz/28 g) / 2 to 3 g  Avoid: Cottage cheese, 4% fat (1 cup) / 6.5 g  Choose: Low-fat cottage cheese, 1% fat (1 cup) / 1.5 g  Avoid: Ice cream (1 cup) / 9 g  Choose: Sherbet (1 cup) / 2.5 g  Choose: Nonfat frozen yogurt (1 cup) / 0.3 g  Choose: Frozen fruit bar / trace  Avoid: Whipped cream (1 tbs) / 3.5 g  Choose: Nondairy whipped topping (1 tbs) / 1 g Condiments / Saturated Fat (g)  Avoid: Mayonnaise (1 tbs) / 2 g  Choose: Low-fat mayonnaise (1 tbs) / 1 g  Avoid: Butter (1 tbs) / 7 g  Choose: Extra light margarine (1 tbs) / 1 g  Avoid: Coconut oil (1 tbs) / 11.8 g  Choose: Olive oil (1 tbs) / 1.8 g  Choose: Corn oil (1 tbs) / 1.7 g  Choose: Safflower oil (1 tbs) / 1.2 g  Choose: Sunflower oil (1 tbs) / 1.4 g  Choose: Soybean oil (1 tbs) / 2.4 g  Choose: Canola oil (1 tbs) / 1 g Document Released: 03/07/2005 Document Revised: 07/02/2012 Document Reviewed: 08/26/2010 Exit[employer] Patient Information 2015 [employer]. This information is not intended to replace advice given to you by your health care provider. Make sure you discuss any questions you have with your health care provider.

## 2013-09-25 NOTE — Assessment & Plan Note (Signed)
This is clearly not cardiac. I spent a long time, greater than 20 minutes, going to the results of his hospitalization and his test results. He has soreness in his left pectoral region and this is most likely musculoskeletal in nature. I've asked him to continue tramadol when necessary and use heat to the shoulder and avoid any aggravating factors.  He notes he has some cardiac risk factors for coronary artery disease. We discussed is quite openly. His lipids are the greatest concern not to mention his weight.  He is not excited about taking a statin for life. I've asked him to lose 25 pounds, really avoid saturated fats, transvaginal and practice a low carbohydrate diet. We'll repeat his lipid profile in 3 months. If his LDL is not 130 or less we will start a statin. He is ok this plan.

## 2013-10-30 ENCOUNTER — Telehealth: Payer: Self-pay | Admitting: *Deleted

## 2013-10-30 NOTE — Telephone Encounter (Signed)
Patient walked in asking to speak to nurse. Patient stated that he has had 3 days of watery stool 3 times each day. Patient states that he feels "queasy" but denies vomiting. States he had popcorn and soda [place] [employer] on the evening that diarrhea began States he's had increased stress over the last two weeks.  WT 254.8 lbs BP 112/66 Pulse 71 Temp 98.5 oral Resp 24 SPO2 96%  Discussed with provider. Patient instructed to increase fluid intake; drink lots of water,  take OTC anti-diarrheal and go to urgent care if diarrhea continues. Patient states understanding.

## 2013-12-26 ENCOUNTER — Other Ambulatory Visit: Payer: Self-pay

## 2014-09-20 IMAGING — CT CT CHEST W/ CM
2 of 3 series · 14 of 36 positions shown, 17 images · IV contrast (omnipaque)
Comparison: Two-view chest x-ray 09/20/2013

CLINICAL DATA: Left-sided chest pain. The pain is worse with
movement and deep inspiration. The pain became acutely worse
yesterday.

EXAM:
CT CHEST WITH CONTRAST
TECHNIQUE: Multidetector CT imaging of the chest was performed during
intravenous contrast administration.
CONTRAST:  100mL OMNIPAQUE IOHEXOL 300 MG/ML  SOLN

[Series 201: chest with, idose (2) · axial · 0.79mm/px · z∈[-428,-158]mm · 11 of 64 slices shown, 14 images]
[im 5/64  mediastinal]
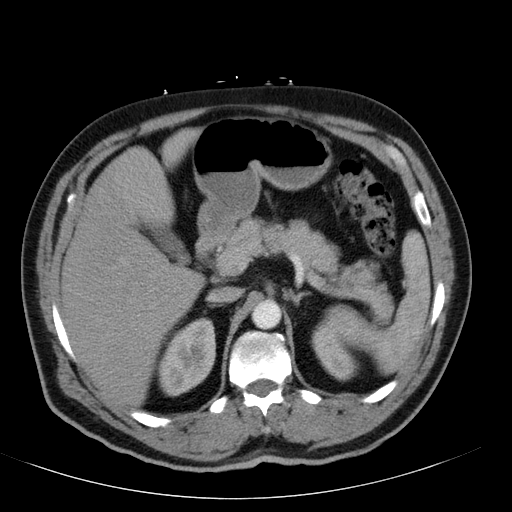
[im 5/64  lung]
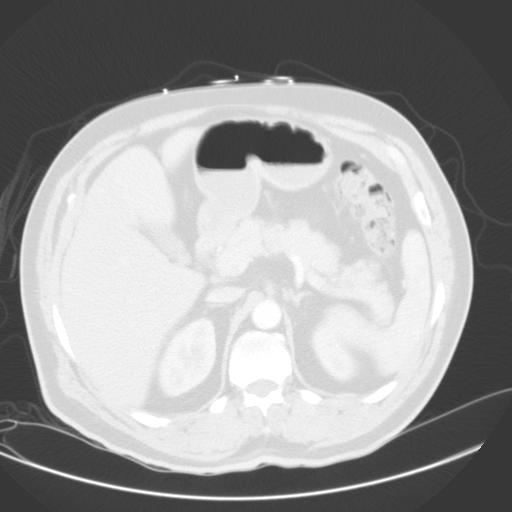
[im 10/64  lung]
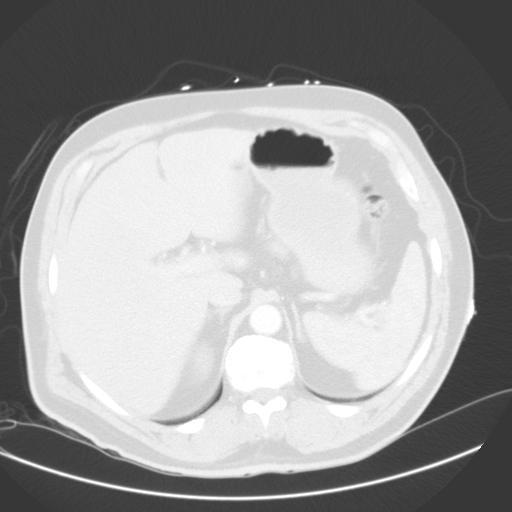
[im 15/64  lung]
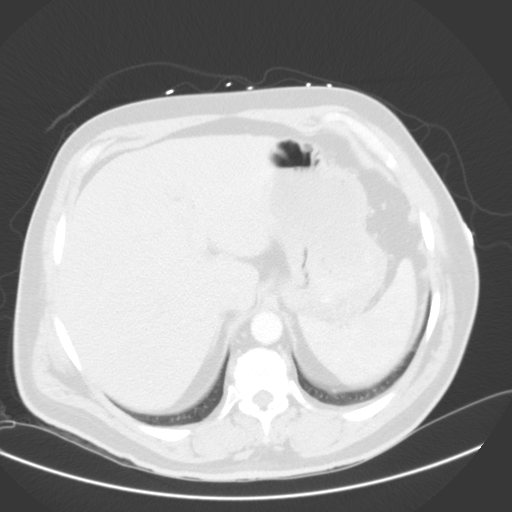
[im 22/64  lung]
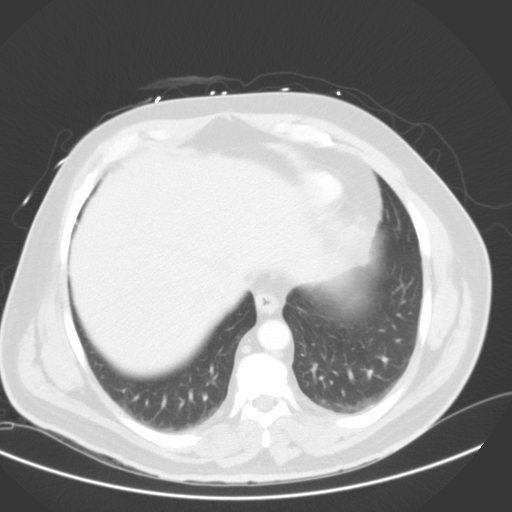
[im 26/64  mediastinal]
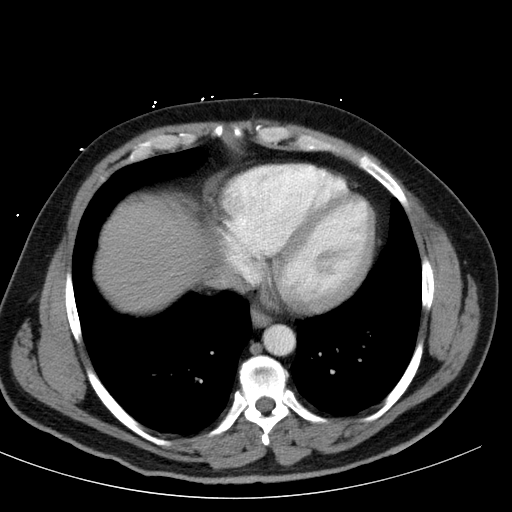
[im 26/64  lung]
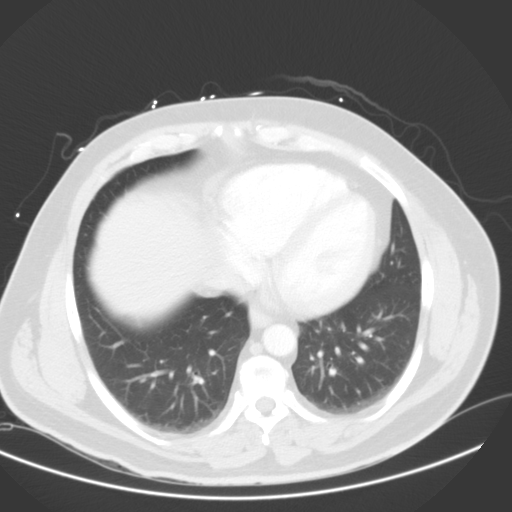
[im 33/64  lung]
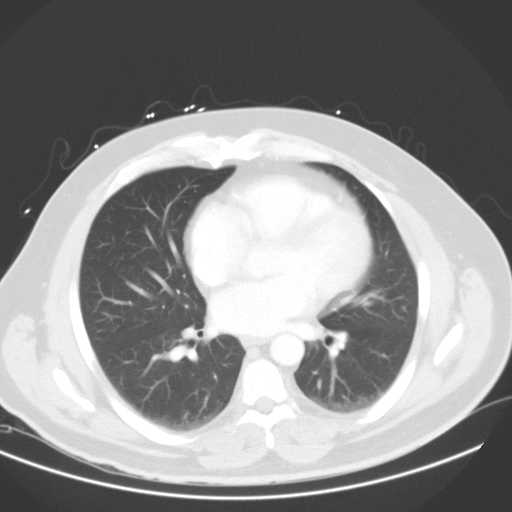
[im 38/64  lung]
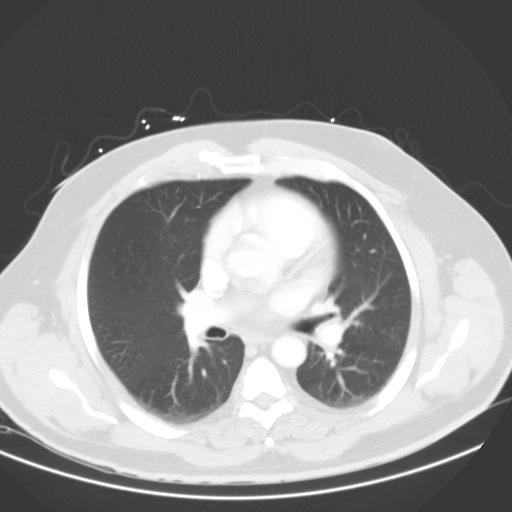
[im 43/64  lung]
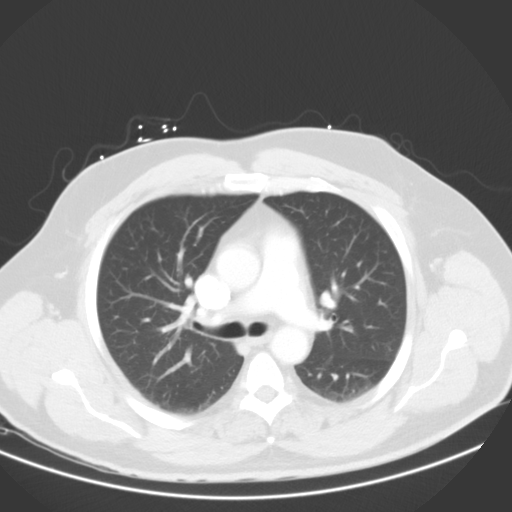
[im 50/64  mediastinal]
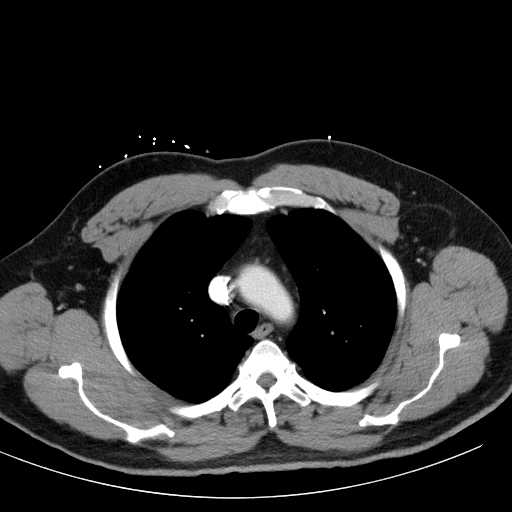
[im 50/64  lung]
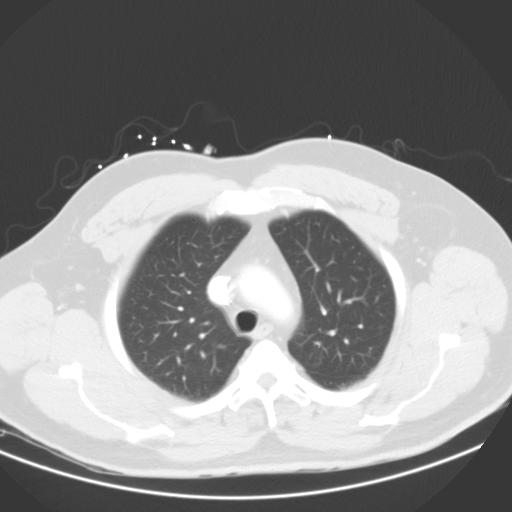
[im 54/64  lung]
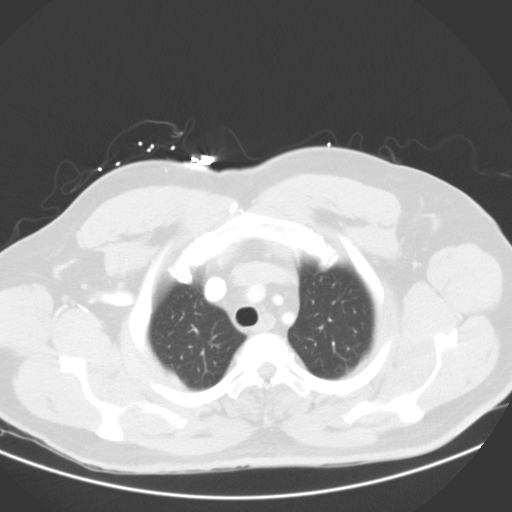
[im 59/64  lung]
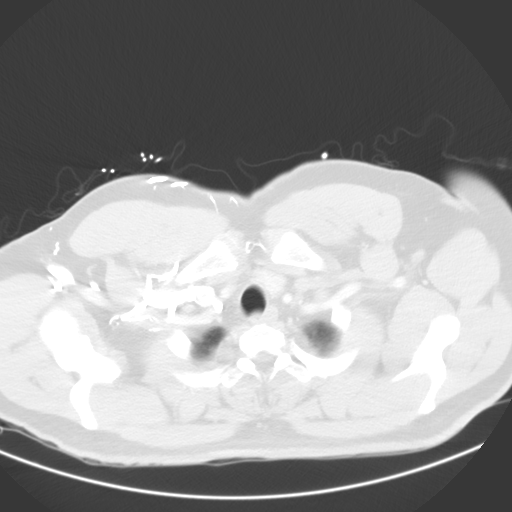

[Series 203: coronal, idose (2) · coronal · 0.50mm/px · 3 of 135 slices shown]
[im 27/135  lung]
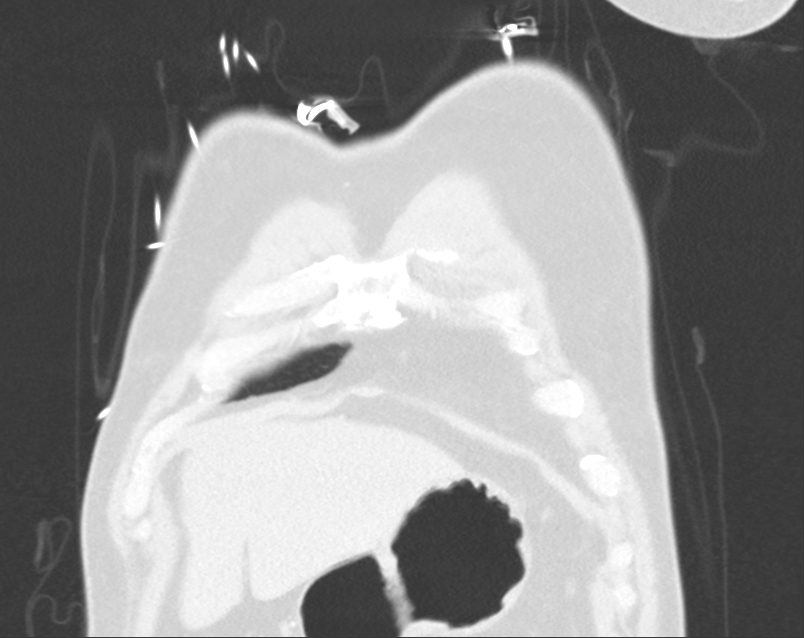
[im 54/135  lung]
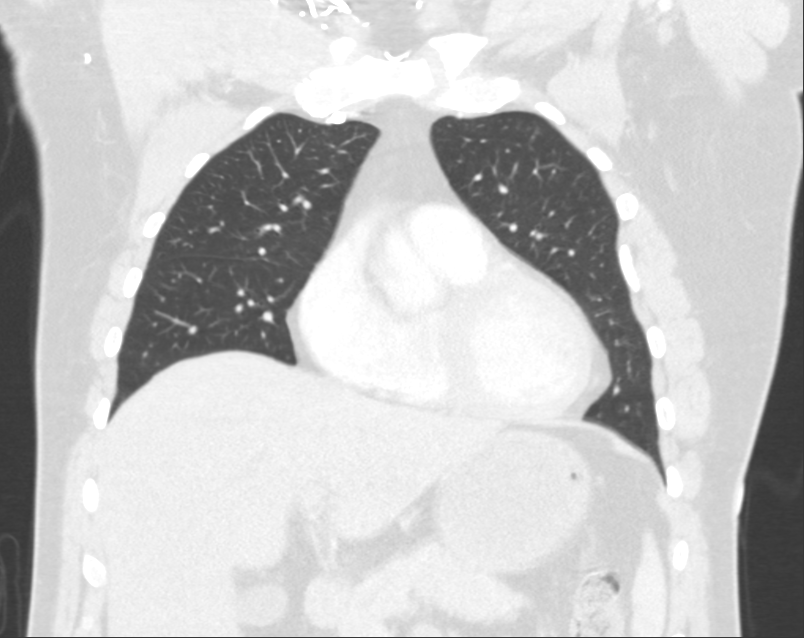
[im 81/135  lung]
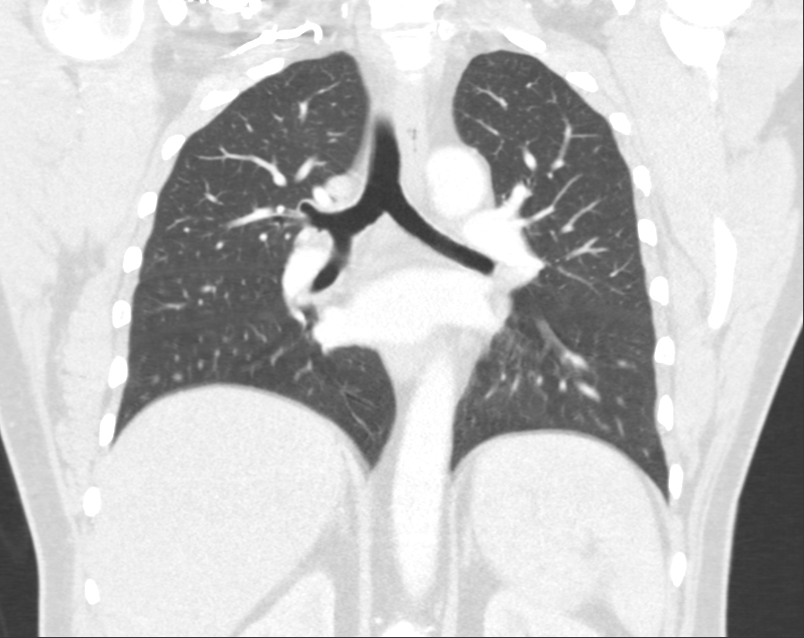

[14 of 36 positions shown; findings below may reference images not displayed]

FINDINGS: The heart size is normal. No significant mediastinal or axillary
adenopathy is present. The thoracic inlet is within normal limits.
Limited imaging of the abdomen is unremarkable.

The lung windows demonstrate mild dependent atelectasis. No focal
nodule, mass, or airspace disease is present.

Bone windows demonstrate a remote superior endplate fracture at T12.
No acute fractures are present. No focal lytic or blastic lesions
are evident.
IMPRESSION: 1. No acute or focal lesion to explain the patient's left-sided
chest pain.
2. Remote T12 superior endplate fracture.

## 2016-06-13 ENCOUNTER — Encounter (HOSPITAL_COMMUNITY): Payer: Self-pay | Admitting: Emergency Medicine

## 2016-06-13 ENCOUNTER — Ambulatory Visit (HOSPITAL_COMMUNITY)
Admission: EM | Admit: 2016-06-13 | Discharge: 2016-06-13 | Disposition: A | Discharge status: Home / Self Care | Payer: Self-pay | Attending: Emergency Medicine | Admitting: Emergency Medicine

## 2016-06-13 DIAGNOSIS — J069 Acute upper respiratory infection, unspecified: Secondary | ICD-10-CM

## 2016-06-13 DIAGNOSIS — B9789 Other viral agents as the cause of diseases classified elsewhere: Secondary | ICD-10-CM

## 2016-06-13 MED ORDER — PREDNISONE 50 MG PO TABS: ORAL_TABLET | ORAL | 0 refills | Status: AC

## 2016-06-13 MED ORDER — GUAIFENESIN-CODEINE 100-10 MG/5ML PO SOLN: 5.0000 mL | Freq: Three times a day (TID) | ORAL | 0 refills | Status: AC | PRN

## 2016-06-13 NOTE — ED Provider Notes (Signed)
CSN: [id number]     Arrival date & time 06/13/16  1315 History   None    Chief Complaint  Patient presents with  . Cough   (Consider location/radiation/quality/duration/timing/severity/associated sxs/prior Treatment) 56 year old male presents to clinic with a 3 to four-day history of cough.   The history is provided by the patient.  Cough  Cough characteristics:  Non-productive, dry, hacking and harsh Sputum characteristics:  Clear Severity:  Severe Onset quality:  Gradual Duration:  4 days Timing:  Constant Progression:  Worsening Chronicity:  New Smoker: no   Context: upper respiratory infection   Relieved by:  Nothing Worsened by:  Nothing Ineffective treatments:  Cough suppressants and decongestant Associated symptoms: rhinorrhea and sinus congestion   Associated symptoms: no chest pain, no chills, no ear fullness, no ear pain, no fever, no headaches, no myalgias, no shortness of breath, no sore throat and no wheezing   Rhinorrhea:    Quality:  Clear   Severity:  Mild   Duration:  1 day   Timing:  Constant   Progression:  Worsening   Past Medical History:  Diagnosis Date  . Arthritis    "all over lately" (09/20/2013)  . Hyperlipemia    Past Surgical History:  Procedure Laterality Date  . NO PAST SURGERIES     Family History  Problem Relation Age of Onset  . Cancer Mother   . Heart failure Father    Social History  Substance Use Topics  . Smoking status: Former Smoker    Packs/day: 0.50    Years: 20.00    Types: Cigarettes    Quit date: 04/06/2007  . Smokeless tobacco: Never Used  . Alcohol use Yes     Comment: 09/20/2013 "drink a couple times/year"    Review of Systems  Constitutional: Negative for chills and fever.  HENT: Positive for rhinorrhea. Negative for ear pain and sore throat.   Respiratory: Positive for cough. Negative for shortness of breath and wheezing.   Cardiovascular: Negative for chest pain.  Gastrointestinal: Negative for  constipation, diarrhea, nausea and vomiting.  Musculoskeletal: Negative for myalgias.  Neurological: Negative for dizziness and headaches.  All other systems reviewed and are negative.   Allergies  Amoxicillin  Home Medications   Prior to Admission medications   Medication Sig Start Date End Date Taking? Authorizing Provider  guaiFENesin-codeine 100-10 MG/5ML syrup Take 5 mLs by mouth 3 (three) times daily as needed for cough. 06/13/16   Dorena BodoLawrence Zarina Pe, NP  predniSONE (DELTASONE) 50 MG tablet Take one tablet daily with food 06/13/16   Dorena BodoLawrence Cartier Mapel, NP   Meds Ordered and Administered this Visit  Medications - No data to display  BP 140/79 (BP Location: Right Arm)   Pulse 86   Temp 99.2 F (37.3 C) (Oral)   Resp 20   SpO2 98%  No data found.   Physical Exam  Constitutional: He is oriented to person, place, and time. He appears well-developed and well-nourished. No distress.  HENT:  Head: Normocephalic and atraumatic.  Right Ear: Tympanic membrane and external ear normal.  Left Ear: Tympanic membrane and external ear normal.  Nose: Rhinorrhea present. Right sinus exhibits no maxillary sinus tenderness and no frontal sinus tenderness. Left sinus exhibits no maxillary sinus tenderness and no frontal sinus tenderness.  Mouth/Throat: Uvula is midline, oropharynx is clear and moist and mucous membranes are normal. No oropharyngeal exudate.  Eyes: Pupils are equal, round, and reactive to light.  Neck: Normal range of motion. Neck supple. No JVD present.  Cardiovascular: Normal rate and regular rhythm.   Pulmonary/Chest: Effort normal and breath sounds normal. No respiratory distress. He has no wheezes. He has no rhonchi. He has no rales.  Abdominal: Soft. Bowel sounds are normal.  Lymphadenopathy:       Head (right side): No submental, no submandibular, no tonsillar and no preauricular adenopathy present.       Head (left side): No submental, no submandibular, no tonsillar and  no preauricular adenopathy present.    He has no cervical adenopathy.  Neurological: He is alert and oriented to person, place, and time.  Skin: Skin is warm and dry. Capillary refill takes less than 2 seconds. No rash noted. He is not diaphoretic. No erythema.  Psychiatric: He has a normal mood and affect.  Nursing note and vitals reviewed.   Urgent Care Course     Procedures (including critical care time)  Labs Review Labs Reviewed - No data to display  Imaging Review No results found.     MDM   1. Viral URI with cough    Treating for viral URI with cough, counseled pt antibiotics would not be helpful for his condition. Provided counseling on over-the-counter therapies for treatment that would help with his symptoms. Prescribed Cheratussin for cough and prednisone. Advised to follow-up with primary care or return to clinic if symptoms persisted past one week     Dorena Bodo, NP 06/13/16 1439

## 2016-06-13 NOTE — ED Triage Notes (Signed)
The patient presented to the [facility] with a complaint of a cough x 4 days. 

## 2016-06-13 NOTE — Discharge Instructions (Signed)
You most likely have a viral URI, I advise rest, plenty of fluids and management of symptoms with over the counter medicines. For symptoms you may take Tylenol as needed every 4-6 hours for body aches or fever, not to exceed 4,000 mg a day, Take mucinex or mucinex DM ever 12 hours with a full glass of water, you may use an inhaled steroid such as Flonase, 2 sprays each nostril once a day for congestion, or an antihistamine such as Claritin or Zyrtec once a day. I have also prescribed a medicine for cough called Cherratussin, this medicine is a narcotic, it will cause drowsiness, and it is addictive. Do not take more than what is necessary, do not drink alcohol while taking, and do not operate any heavy machinery while taking this medicine. Should your symptoms worsen or fail to resolve, follow up with your primary care provider or return to clinic.

## 2019-02-22 ENCOUNTER — Other Ambulatory Visit: Payer: Self-pay

## 2019-02-22 DIAGNOSIS — Z20822 Contact with and (suspected) exposure to covid-19: Secondary | ICD-10-CM

## 2019-02-25 ENCOUNTER — Telehealth: Payer: Self-pay | Admitting: General Practice

## 2019-02-25 LAB — NOVEL CORONAVIRUS, NAA: SARS-CoV-2, NAA: NOT DETECTED

## 2019-02-25 NOTE — Telephone Encounter (Signed)
Negative COVID results given. Patient results "NOT Detected." Caller expressed understanding. ° °

## 2019-06-29 ENCOUNTER — Ambulatory Visit: Payer: Self-pay | Attending: Internal Medicine

## 2019-06-29 DIAGNOSIS — Z23 Encounter for immunization: Secondary | ICD-10-CM

## 2019-06-29 NOTE — Progress Notes (Signed)
   Covid-19 Vaccination Clinic  Name:  Russell Brennan    MRN: [id number] DOB: [birth year]  06/29/2019  [name] was observed post Covid-19 immunization for 15 minutes without incident. He was provided with Vaccine Information Sheet and instruction to access the V-Safe system.   [name] was instructed to call 911 with any severe reactions post vaccine: Marland Kitchen Difficulty breathing  . Swelling of face and throat  . A fast heartbeat  . A bad rash all over body  . Dizziness and weakness   Immunizations Administered    Name Date Dose VIS Date Route   Pfizer COVID-19 Vaccine 06/29/2019  2:32 PM 0.3 mL 03/01/2019 Intramuscular   Manufacturer: [employer], [employer]   Lot: [id number]   NDC: [address]-1

## 2019-07-22 ENCOUNTER — Ambulatory Visit: Payer: Self-pay | Attending: Internal Medicine

## 2019-07-22 DIAGNOSIS — Z23 Encounter for immunization: Secondary | ICD-10-CM

## 2019-07-22 NOTE — Progress Notes (Signed)
   Covid-19 Vaccination Clinic  Name:  Russell Brennan    MRN: [id number] DOB: Jul 30, 1960  07/22/2019  [name] was observed post Covid-19 immunization for 15 minutes without incident. He was provided with Vaccine Information Sheet and instruction to access the V-Safe system.   [name] was instructed to call 911 with any severe reactions post vaccine: Marland Kitchen Difficulty breathing  . Swelling of face and throat  . A fast heartbeat  . A bad rash all over body  . Dizziness and weakness   Immunizations Administered    Name Date Dose VIS Date Route   Pfizer COVID-19 Vaccine 07/22/2019  1:11 PM 0.3 mL 05/15/2018 Intramuscular   Manufacturer: [employer], [employer]   Lot: [id number]   NDC: [address]-5

## 2020-02-02 ENCOUNTER — Ambulatory Visit: Payer: Self-pay | Attending: Internal Medicine

## 2020-02-02 DIAGNOSIS — Z23 Encounter for immunization: Secondary | ICD-10-CM

## 2020-02-02 NOTE — Progress Notes (Signed)
   Covid-19 Vaccination Clinic  Name:  Russell Brennan    MRN: [id number] DOB: [birth year]  02/02/2020  [name] was observed post Covid-19 immunization for 15 minutes without incident. He was provided with Vaccine Information Sheet and instruction to access the V-Safe system.   [name] was instructed to call 911 with any severe reactions post vaccine: Marland Kitchen Difficulty breathing  . Swelling of face and throat  . A fast heartbeat  . A bad rash all over body  . Dizziness and weakness   Immunizations Administered    Name Date Dose VIS Date Route   Pfizer COVID-19 Vaccine 02/02/2020  9:34 AM 0.3 mL 01/08/2020 Intramuscular   Manufacturer: [employer], [employer]   Lot: [id number]   NDC: [address]-3

## 2020-10-09 ENCOUNTER — Other Ambulatory Visit: Payer: Self-pay

## 2020-10-09 ENCOUNTER — Ambulatory Visit: Payer: Self-pay | Attending: Internal Medicine

## 2020-10-09 DIAGNOSIS — Z20822 Contact with and (suspected) exposure to covid-19: Secondary | ICD-10-CM | POA: Insufficient documentation

## 2020-10-11 ENCOUNTER — Telehealth: Payer: Self-pay

## 2020-10-11 LAB — SARS-COV-2, NAA 2 DAY TAT

## 2020-10-11 LAB — SPECIMEN STATUS REPORT

## 2020-10-11 LAB — NOVEL CORONAVIRUS, NAA: SARS-CoV-2, NAA: NOT DETECTED

## 2020-10-11 NOTE — Telephone Encounter (Signed)
Pt informed test for Covid was negative. Pt had an exposure and wants to schedule 2nd booster. Pt scheduled for 10/15/20 1000. Pt denied any covid sx.  Sent pt sign up information for MyChart.

## 2020-10-15 ENCOUNTER — Ambulatory Visit: Payer: Self-pay
